# Patient Record
Sex: Male | Born: 1985 | Race: White | Hispanic: No | Marital: Single | Smoking: Current every day smoker
Health system: Southern US, Community
[De-identification: ages and names within clinical notes are randomized; demographics above are authoritative.]

## PROBLEM LIST (undated history)

## (undated) DIAGNOSIS — K649 Unspecified hemorrhoids: Secondary | ICD-10-CM

## (undated) DIAGNOSIS — M549 Dorsalgia, unspecified: Secondary | ICD-10-CM

## (undated) DIAGNOSIS — B029 Zoster without complications: Secondary | ICD-10-CM

## (undated) DIAGNOSIS — G8929 Other chronic pain: Secondary | ICD-10-CM

## (undated) DIAGNOSIS — M5431 Sciatica, right side: Secondary | ICD-10-CM

## (undated) DIAGNOSIS — B009 Herpesviral infection, unspecified: Secondary | ICD-10-CM

## (undated) HISTORY — DX: Unspecified hemorrhoids: K64.9

---
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
## 2004-01-28 HISTORY — PX: FACIAL FRACTURE SURGERY: SHX1570

## 2004-09-22 ENCOUNTER — Emergency Department (HOSPITAL_COMMUNITY): Admission: EM | Admit: 2004-09-22 | Discharge: 2004-09-22 | Payer: Self-pay | Admitting: Emergency Medicine

## 2005-11-02 ENCOUNTER — Emergency Department: Payer: Self-pay | Admitting: Unknown Physician Specialty

## 2006-06-23 ENCOUNTER — Emergency Department (HOSPITAL_COMMUNITY): Admission: EM | Admit: 2006-06-23 | Discharge: 2006-06-23 | Payer: Self-pay | Admitting: Emergency Medicine

## 2006-07-22 ENCOUNTER — Emergency Department (HOSPITAL_COMMUNITY): Admission: EM | Admit: 2006-07-22 | Discharge: 2006-07-22 | Payer: Self-pay | Admitting: Emergency Medicine

## 2006-08-03 ENCOUNTER — Emergency Department (HOSPITAL_COMMUNITY): Admission: EM | Admit: 2006-08-03 | Discharge: 2006-08-04 | Payer: Self-pay | Admitting: Emergency Medicine

## 2006-08-11 ENCOUNTER — Ambulatory Visit (HOSPITAL_COMMUNITY): Admission: RE | Admit: 2006-08-11 | Discharge: 2006-08-12 | Payer: Self-pay | Admitting: Otolaryngology

## 2007-05-25 ENCOUNTER — Emergency Department (HOSPITAL_COMMUNITY): Admission: EM | Admit: 2007-05-25 | Discharge: 2007-05-25 | Payer: Self-pay | Admitting: Emergency Medicine

## 2007-05-31 ENCOUNTER — Emergency Department (HOSPITAL_COMMUNITY): Admission: EM | Admit: 2007-05-31 | Discharge: 2007-05-31 | Payer: Self-pay | Admitting: Emergency Medicine

## 2007-12-13 ENCOUNTER — Emergency Department (HOSPITAL_COMMUNITY): Admission: EM | Admit: 2007-12-13 | Discharge: 2007-12-13 | Payer: Self-pay | Admitting: Emergency Medicine

## 2008-07-14 ENCOUNTER — Emergency Department (HOSPITAL_COMMUNITY): Admission: EM | Admit: 2008-07-14 | Discharge: 2008-07-14 | Payer: Self-pay | Admitting: Emergency Medicine

## 2008-08-24 ENCOUNTER — Emergency Department (HOSPITAL_COMMUNITY): Admission: EM | Admit: 2008-08-24 | Discharge: 2008-08-24 | Payer: Self-pay | Admitting: Emergency Medicine

## 2009-05-06 ENCOUNTER — Emergency Department (HOSPITAL_COMMUNITY): Admission: EM | Admit: 2009-05-06 | Discharge: 2009-05-06 | Payer: Self-pay | Admitting: Emergency Medicine

## 2009-07-31 ENCOUNTER — Emergency Department (HOSPITAL_COMMUNITY): Admission: EM | Admit: 2009-07-31 | Discharge: 2009-07-31 | Payer: Self-pay | Admitting: Emergency Medicine

## 2010-02-18 ENCOUNTER — Encounter: Payer: Self-pay | Admitting: Neurosurgery

## 2010-03-18 ENCOUNTER — Ambulatory Visit (HOSPITAL_COMMUNITY): Payer: Self-pay | Admitting: Physical Therapy

## 2010-03-19 ENCOUNTER — Other Ambulatory Visit (HOSPITAL_COMMUNITY): Payer: Self-pay | Admitting: Orthopaedic Surgery

## 2010-03-19 DIAGNOSIS — M545 Low back pain, unspecified: Secondary | ICD-10-CM

## 2010-03-22 ENCOUNTER — Ambulatory Visit (HOSPITAL_COMMUNITY): Admission: RE | Admit: 2010-03-22 | Payer: Medicaid Other | Source: Ambulatory Visit

## 2010-04-27 ENCOUNTER — Emergency Department (HOSPITAL_COMMUNITY): Payer: Medicaid Other

## 2010-04-27 ENCOUNTER — Emergency Department (HOSPITAL_COMMUNITY)
Admission: EM | Admit: 2010-04-27 | Discharge: 2010-04-27 | Disposition: A | Discharge status: Home / Self Care | Payer: Medicaid Other | Attending: Emergency Medicine | Admitting: Emergency Medicine

## 2010-04-27 DIAGNOSIS — S0003XA Contusion of scalp, initial encounter: Secondary | ICD-10-CM | POA: Insufficient documentation

## 2010-04-27 DIAGNOSIS — S0510XA Contusion of eyeball and orbital tissues, unspecified eye, initial encounter: Secondary | ICD-10-CM | POA: Insufficient documentation

## 2010-04-27 DIAGNOSIS — S058X9A Other injuries of unspecified eye and orbit, initial encounter: Secondary | ICD-10-CM | POA: Insufficient documentation

## 2010-04-27 DIAGNOSIS — S1093XA Contusion of unspecified part of neck, initial encounter: Secondary | ICD-10-CM | POA: Insufficient documentation
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
## 2010-07-16 ENCOUNTER — Emergency Department (HOSPITAL_COMMUNITY): Payer: Medicaid Other

## 2010-07-16 ENCOUNTER — Emergency Department (HOSPITAL_COMMUNITY)
Admission: EM | Admit: 2010-07-16 | Discharge: 2010-07-16 | Disposition: A | Discharge status: Home / Self Care | Payer: Medicaid Other | Attending: Emergency Medicine | Admitting: Emergency Medicine

## 2010-07-16 DIAGNOSIS — Y998 Other external cause status: Secondary | ICD-10-CM | POA: Insufficient documentation

## 2010-07-16 DIAGNOSIS — S93409A Sprain of unspecified ligament of unspecified ankle, initial encounter: Secondary | ICD-10-CM | POA: Insufficient documentation

## 2010-07-16 DIAGNOSIS — X500XXA Overexertion from strenuous movement or load, initial encounter: Secondary | ICD-10-CM | POA: Insufficient documentation

## 2010-11-12 LAB — CBC
MCHC: 34.6
MCV: 91.4
RBC: 4.53

## 2011-09-01 ENCOUNTER — Other Ambulatory Visit (HOSPITAL_COMMUNITY): Payer: Self-pay | Admitting: *Deleted

## 2011-09-01 DIAGNOSIS — D499 Neoplasm of unspecified behavior of unspecified site: Secondary | ICD-10-CM

## 2011-09-03 ENCOUNTER — Ambulatory Visit (HOSPITAL_COMMUNITY): Payer: Medicaid Other

## 2011-11-21 ENCOUNTER — Encounter (HOSPITAL_COMMUNITY): Payer: Self-pay

## 2011-11-21 ENCOUNTER — Emergency Department (HOSPITAL_COMMUNITY): Payer: Medicaid Other

## 2011-11-21 ENCOUNTER — Emergency Department (HOSPITAL_COMMUNITY)
Admission: EM | Admit: 2011-11-21 | Discharge: 2011-11-21 | Disposition: A | Discharge status: Home / Self Care | Payer: Medicaid Other | Attending: Emergency Medicine | Admitting: Emergency Medicine

## 2011-11-21 DIAGNOSIS — Y929 Unspecified place or not applicable: Secondary | ICD-10-CM | POA: Insufficient documentation

## 2011-11-21 DIAGNOSIS — Z0489 Encounter for examination and observation for other specified reasons: Secondary | ICD-10-CM | POA: Insufficient documentation

## 2011-11-21 DIAGNOSIS — IMO0002 Reserved for concepts with insufficient information to code with codable children: Secondary | ICD-10-CM | POA: Insufficient documentation

## 2011-11-21 DIAGNOSIS — R51 Headache: Secondary | ICD-10-CM | POA: Insufficient documentation

## 2011-11-21 DIAGNOSIS — F17200 Nicotine dependence, unspecified, uncomplicated: Secondary | ICD-10-CM | POA: Insufficient documentation

## 2011-11-21 DIAGNOSIS — M549 Dorsalgia, unspecified: Secondary | ICD-10-CM | POA: Insufficient documentation

## 2011-11-21 DIAGNOSIS — Y939 Activity, unspecified: Secondary | ICD-10-CM | POA: Insufficient documentation

## 2011-11-21 DIAGNOSIS — T07XXXA Unspecified multiple injuries, initial encounter: Secondary | ICD-10-CM | POA: Insufficient documentation

## 2011-11-21 MED ORDER — OXYCODONE-ACETAMINOPHEN 5-325 MG PO TABS: 1.0000 | ORAL_TABLET | ORAL | Status: DC | PRN

## 2011-11-21 MED ORDER — OXYCODONE-ACETAMINOPHEN 5-325 MG PO TABS
2.0000 | ORAL_TABLET | Freq: Once | ORAL | Status: AC
  Administered 2011-11-21: 2 via ORAL
  Filled 2011-11-21: qty 2
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
## 2012-02-02 ENCOUNTER — Encounter (HOSPITAL_COMMUNITY): Payer: Self-pay

## 2012-02-02 ENCOUNTER — Emergency Department (HOSPITAL_COMMUNITY)
Admission: EM | Admit: 2012-02-02 | Discharge: 2012-02-02 | Disposition: A | Discharge status: Home / Self Care | Payer: Medicaid Other | Attending: Emergency Medicine | Admitting: Emergency Medicine

## 2012-02-02 DIAGNOSIS — F17200 Nicotine dependence, unspecified, uncomplicated: Secondary | ICD-10-CM | POA: Insufficient documentation

## 2012-02-02 DIAGNOSIS — K0889 Other specified disorders of teeth and supporting structures: Secondary | ICD-10-CM

## 2012-02-02 DIAGNOSIS — K089 Disorder of teeth and supporting structures, unspecified: Secondary | ICD-10-CM | POA: Insufficient documentation

## 2012-02-02 DIAGNOSIS — X58XXXA Exposure to other specified factors, initial encounter: Secondary | ICD-10-CM | POA: Insufficient documentation

## 2012-02-02 DIAGNOSIS — Y929 Unspecified place or not applicable: Secondary | ICD-10-CM | POA: Insufficient documentation

## 2012-02-02 DIAGNOSIS — Y9389 Activity, other specified: Secondary | ICD-10-CM | POA: Insufficient documentation

## 2012-02-02 DIAGNOSIS — S025XXA Fracture of tooth (traumatic), initial encounter for closed fracture: Secondary | ICD-10-CM | POA: Insufficient documentation

## 2012-02-02 DIAGNOSIS — Z8739 Personal history of other diseases of the musculoskeletal system and connective tissue: Secondary | ICD-10-CM | POA: Insufficient documentation

## 2012-02-02 MED ORDER — OXYCODONE-ACETAMINOPHEN 5-325 MG PO TABS
1.0000 | ORAL_TABLET | Freq: Once | ORAL | Status: AC
  Administered 2012-02-02: 1 via ORAL
  Filled 2012-02-02: qty 1

## 2012-02-02 MED ORDER — OXYCODONE-ACETAMINOPHEN 5-325 MG PO TABS: 1.0000 | ORAL_TABLET | ORAL | Status: DC | PRN

## 2012-02-02 MED ORDER — CLINDAMYCIN HCL 150 MG PO CAPS: 150.0000 mg | ORAL_CAPSULE | Freq: Four times a day (QID) | ORAL | Status: DC

## 2012-02-02 NOTE — ED Notes (Signed)
Pt reports breaking a piece of his tooth off on Friday while eating some peanuts.  Pt reports severe pain and swelling.

## 2012-02-02 NOTE — ED Provider Notes (Signed)
History     CSN: [id number]  Arrival date & time 02/02/12  1021   First MD Initiated Contact with Patient 02/02/12 1048      Chief Complaint  Patient presents with  . Dental Pain     Patient is a 27 y.o. male presenting with tooth pain. The history is provided by the patient.  Dental PainThe primary symptoms include mouth pain. Primary symptoms do not include fever. The symptoms began 3 to 5 days ago. The symptoms are worsening. The symptoms are new. The symptoms occur constantly.  pt reports he may have broken off a tooth while eating peanuts last week   Past Medical History  Diagnosis Date  . Back pain     History reviewed. No pertinent past surgical history.  No family history on file.  History  Substance Use Topics  . Smoking status: Current Every Day Smoker  . Smokeless tobacco: Not on file  . Alcohol Use: No      Review of Systems  Constitutional: Negative for fever.  Gastrointestinal: Negative for vomiting.    Allergies  Penicillins and Hydrocodone  Home Medications   Current Outpatient Rx  Name  Route  Sig  Dispense  Refill  . CLINDAMYCIN HCL 150 MG PO CAPS   Oral   Take 1 capsule (150 mg total) by mouth every 6 (six) hours.   28 capsule   0   . OXYCODONE-ACETAMINOPHEN 5-325 MG PO TABS   Oral   Take 1 tablet by mouth every 4 (four) hours as needed for pain.   5 tablet   0     BP 145/84  Pulse 109  Temp 97.5 F (36.4 C) (Oral)  Resp 18  Wt 200 lb (90.719 kg)  SpO2 98%  Physical Exam CONSTITUTIONAL: Well developed/well nourished HEAD AND FACE: Normocephalic/atraumatic EYES: EOMI/PERRL ENMT: Mucous membranes moist.  Poor dentition.  No trismus.  No focal abscess noted. Fractured tooth noted to right upper premolar NECK: supple no meningeal signs CV: S1/S2 noted, no murmurs/rubs/gallops noted LUNGS: Lungs are clear to auscultation bilaterally, no apparent distress ABDOMEN: soft, nontender, no rebound or guarding NEURO: Pt is  awake/alert, moves all extremitiesx4 EXTREMITIES:full ROM SKIN: warm, color normal  ED Course  Procedures   1. Pain, dental       MDM  Nursing notes including past medical history and social history reviewed and considered in documentation  Pt reports he already has dental f/u this week He reports he can take percocet         Joya Gaskins, MD 02/02/12 1059

## 2012-02-25 ENCOUNTER — Encounter (HOSPITAL_COMMUNITY): Payer: Self-pay | Admitting: Emergency Medicine

## 2012-02-25 ENCOUNTER — Emergency Department (HOSPITAL_COMMUNITY): Payer: Medicaid Other

## 2012-02-25 ENCOUNTER — Emergency Department (HOSPITAL_COMMUNITY)
Admission: EM | Admit: 2012-02-25 | Discharge: 2012-02-25 | Disposition: A | Discharge status: Home / Self Care | Payer: Medicaid Other | Attending: Emergency Medicine | Admitting: Emergency Medicine

## 2012-02-25 DIAGNOSIS — F17200 Nicotine dependence, unspecified, uncomplicated: Secondary | ICD-10-CM | POA: Insufficient documentation

## 2012-02-25 DIAGNOSIS — S39012A Strain of muscle, fascia and tendon of lower back, initial encounter: Secondary | ICD-10-CM

## 2012-02-25 DIAGNOSIS — Y939 Activity, unspecified: Secondary | ICD-10-CM | POA: Insufficient documentation

## 2012-02-25 DIAGNOSIS — S59909A Unspecified injury of unspecified elbow, initial encounter: Secondary | ICD-10-CM | POA: Insufficient documentation

## 2012-02-25 DIAGNOSIS — Y9289 Other specified places as the place of occurrence of the external cause: Secondary | ICD-10-CM | POA: Insufficient documentation

## 2012-02-25 DIAGNOSIS — S6990XA Unspecified injury of unspecified wrist, hand and finger(s), initial encounter: Secondary | ICD-10-CM | POA: Insufficient documentation

## 2012-02-25 DIAGNOSIS — W010XXA Fall on same level from slipping, tripping and stumbling without subsequent striking against object, initial encounter: Secondary | ICD-10-CM | POA: Insufficient documentation

## 2012-02-25 DIAGNOSIS — S335XXA Sprain of ligaments of lumbar spine, initial encounter: Secondary | ICD-10-CM | POA: Insufficient documentation

## 2012-02-25 DIAGNOSIS — S92919A Unspecified fracture of unspecified toe(s), initial encounter for closed fracture: Secondary | ICD-10-CM | POA: Insufficient documentation

## 2012-02-25 MED ORDER — OXYCODONE-ACETAMINOPHEN 5-325 MG PO TABS: 1.0000 | ORAL_TABLET | ORAL | Status: AC | PRN

## 2012-02-25 MED ORDER — IBUPROFEN 600 MG PO TABS: 600.0000 mg | ORAL_TABLET | Freq: Four times a day (QID) | ORAL | Status: AC | PRN

## 2012-02-25 MED ORDER — OXYCODONE-ACETAMINOPHEN 5-325 MG PO TABS
1.0000 | ORAL_TABLET | Freq: Once | ORAL | Status: AC
  Administered 2012-02-25: 1 via ORAL
  Filled 2012-02-25: qty 1

## 2012-02-25 MED ORDER — IBUPROFEN 800 MG PO TABS
800.0000 mg | ORAL_TABLET | Freq: Once | ORAL | Status: AC
  Administered 2012-02-25: 800 mg via ORAL
  Filled 2012-02-25: qty 1

## 2012-02-25 NOTE — ED Provider Notes (Signed)
History     CSN: [id number]  Arrival date & time 02/25/12  1402   First MD Initiated Contact with Patient 02/25/12 1443      Chief Complaint  Patient presents with  . Fall    (Consider location/radiation/quality/duration/timing/severity/associated sxs/prior treatment) HPI Comments: Alex Mullen is a 27 y.o. Male presenting with pain in his right lower back which radiates into his posterior upper thigh, left foot especially fifth toe pain with ecchymosis and left wrist pain after slipping in his hallway yesterday evening, causing him to slide down a flight of steps.  He has been ambulatory today but with discomfort.  He is fairly certain he has broken his left fifth toe.  He does have a history of prior injury to his lower back which was completely healed prior to yesterday's fall.  Pain is constant but worse with certain movements.  He denies weakness or numbness in his lower extremities and has had no urinary or bowel incontinence or retention.  He has used no medications prior to arrival for treatment of his pain.     The history is provided by the patient.    Past Medical History  Diagnosis Date  . Back pain     History reviewed. No pertinent past surgical history.  History reviewed. No pertinent family history.  History  Substance Use Topics  . Smoking status: Current Every Day Smoker  . Smokeless tobacco: Not on file  . Alcohol Use: No      Review of Systems  Constitutional: Negative for fever.  Respiratory: Negative for shortness of breath.   Cardiovascular: Negative for chest pain and leg swelling.  Gastrointestinal: Negative for abdominal pain, constipation and abdominal distention.  Genitourinary: Negative for dysuria, urgency, frequency, flank pain and difficulty urinating.  Musculoskeletal: Positive for back pain and arthralgias. Negative for joint swelling and gait problem.  Skin: Negative for rash.  Neurological: Negative for weakness and  numbness.    Allergies  Penicillins and Hydrocodone  Home Medications   Current Outpatient Rx  Name  Route  Sig  Dispense  Refill  . IBUPROFEN 600 MG PO TABS   Oral   Take 1 tablet (600 mg total) by mouth every 6 (six) hours as needed for pain.   20 tablet   0   . OXYCODONE-ACETAMINOPHEN 5-325 MG PO TABS   Oral   Take 1 tablet by mouth every 4 (four) hours as needed for pain.   20 tablet   0     BP 115/94  Pulse 102  Temp 97.4 F (36.3 C) (Oral)  Resp 17  SpO2 100%  Physical Exam  Nursing note and vitals reviewed. Constitutional: He appears well-developed and well-nourished.  HENT:  Head: Normocephalic and atraumatic.  Eyes: Conjunctivae normal are normal.  Neck: Normal range of motion. Neck supple.  Cardiovascular: Normal rate and intact distal pulses.        Pedal pulses normal.  Pulmonary/Chest: Effort normal.  Abdominal: Soft. Bowel sounds are normal. He exhibits no distension and no mass.  Musculoskeletal: Normal range of motion. He exhibits tenderness. He exhibits no edema.       Lumbar back: He exhibits tenderness. He exhibits no swelling, no edema and no spasm.       Left foot: He exhibits tenderness and swelling.       Right paralumbar tenderness to palpation.  No spinous process tenderness or SI joint tenderness.  Edema and ecchymosis noted around the proximal fifth left toe.  Distal sensation  intact, less than 3 second cap refill.  Neurological: He is alert. He has normal strength. He displays no atrophy, no tremor and normal reflexes. No sensory deficit. Gait normal.  Reflex Scores:      Patellar reflexes are 2+ on the right side and 2+ on the left side.      Achilles reflexes are 2+ on the right side and 2+ on the left side.      No strength deficit noted in hip and knee flexor and extensor muscle groups.  Ankle flexion and extension intact.  Skin: Skin is warm and dry.  Psychiatric: He has a normal mood and affect.    ED Course  Procedures  (including critical care time)      Labs Reviewed - No data to display Dg Lumbar Spine Complete  02/25/2012  *RADIOLOGY REPORT*  Clinical Data: Fall.  Low back pain.  LUMBAR SPINE - COMPLETE 4+ VIEW  Comparison: 11/21/2011  Findings: There are five lumbar-type vertebral bodies.  No fracture or malalignment.  Disc spaces well maintained.  SI joints are symmetric.  IMPRESSION: Normal study.   Original Report Authenticated By: Charlett Nose, M.D.    Dg Wrist Complete Left  02/25/2012  *RADIOLOGY REPORT*  Clinical Data: Fall.  Wrist pain.  LEFT WRIST - COMPLETE 3+ VIEW  Comparison: None.  Findings: No acute bony abnormality.  Specifically, no fracture, subluxation, or dislocation.  Soft tissues are intact.  Joint spaces are maintained.  IMPRESSION: No acute bony abnormality.   Original Report Authenticated By: Charlett Nose, M.D.    Dg Foot Complete Left  02/25/2012  *RADIOLOGY REPORT*  Clinical Data: Fall, foot pain.  LEFT FOOT - COMPLETE 3+ VIEW  Comparison: None  Findings: There is a fracture through the left fifth toe proximal phalanx and the.  This is nondisplaced and.  No visible intra- articular extension.  No additional acute bony abnormality.  IMPRESSION: Fracture through the left fifth toe proximal phalanx.   Original Report Authenticated By: Charlett Nose, M.D.      1. Fracture of phalanx of toe   2. Strain of lumbar spine       MDM  X-rays reviewed prior to discharge home.  Patient placed in a postop shoe with buddy tape.  Work note given for light duty.  Prescribed Percocet and ibuprofen for pain relief.  Referral to Dr. Hilda Lias for followup care of his injury.        Burgess Amor, [place] 02/25/12 [phone]

## 2012-02-25 NOTE — ED Provider Notes (Signed)
Medical screening examination/treatment/procedure(s) were performed by non-physician practitioner and as supervising physician I was immediately available for consultation/collaboration.   Shiven Junious L Maelyn Berrey, MD 02/25/12 2038 

## 2012-02-25 NOTE — ED Notes (Signed)
Pt tripped and fell last night. C/o pain to L pinky toe. Slight redness noted. C/o chronic pain to lower back and is hurting/burning now and going down r leg.

## 2012-02-25 NOTE — ED Notes (Signed)
Pt reports that one of his friends will be his ride home. Pt reports that he is not driving.
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
## 2012-02-28 DIAGNOSIS — B009 Herpesviral infection, unspecified: Secondary | ICD-10-CM

## 2012-02-28 HISTORY — DX: Herpesviral infection, unspecified: B00.9

## 2012-03-16 ENCOUNTER — Emergency Department (HOSPITAL_COMMUNITY)
Admission: EM | Admit: 2012-03-16 | Discharge: 2012-03-16 | Disposition: A | Discharge status: Home / Self Care | Payer: Medicaid Other | Attending: Emergency Medicine | Admitting: Emergency Medicine

## 2012-03-16 ENCOUNTER — Encounter (HOSPITAL_COMMUNITY): Payer: Self-pay

## 2012-03-16 DIAGNOSIS — F17200 Nicotine dependence, unspecified, uncomplicated: Secondary | ICD-10-CM | POA: Insufficient documentation

## 2012-03-16 DIAGNOSIS — L03211 Cellulitis of face: Secondary | ICD-10-CM | POA: Insufficient documentation

## 2012-03-16 DIAGNOSIS — L0201 Cutaneous abscess of face: Secondary | ICD-10-CM | POA: Insufficient documentation

## 2012-03-16 DIAGNOSIS — Z8619 Personal history of other infectious and parasitic diseases: Secondary | ICD-10-CM | POA: Insufficient documentation

## 2012-03-16 DIAGNOSIS — R599 Enlarged lymph nodes, unspecified: Secondary | ICD-10-CM | POA: Insufficient documentation

## 2012-03-16 DIAGNOSIS — Z8739 Personal history of other diseases of the musculoskeletal system and connective tissue: Secondary | ICD-10-CM | POA: Insufficient documentation

## 2012-03-16 LAB — CBC WITH DIFFERENTIAL/PLATELET
Basophils Relative: 0 % (ref 0–1)
Eosinophils Absolute: 0.1 10*3/uL (ref 0.0–0.7)
Eosinophils Relative: 1 % (ref 0–5)
HCT: 42.1 % (ref 39.0–52.0)
Hemoglobin: 15.4 g/dL (ref 13.0–17.0)
Lymphs Abs: 1.7 10*3/uL (ref 0.7–4.0)
MCH: 32.6 pg (ref 26.0–34.0)
MCHC: 36.6 g/dL — ABNORMAL HIGH (ref 30.0–36.0)
MCV: 89 fL (ref 78.0–100.0)
Monocytes Absolute: 0.6 10*3/uL (ref 0.1–1.0)
Monocytes Relative: 7 % (ref 3–12)
Neutrophils Relative %: 72 % (ref 43–77)
RBC: 4.73 MIL/uL (ref 4.22–5.81)

## 2012-03-16 LAB — BASIC METABOLIC PANEL
BUN: 8 mg/dL (ref 6–23)
CO2: 25 mEq/L (ref 19–32)
Glucose, Bld: 107 mg/dL — ABNORMAL HIGH (ref 70–99)
Potassium: 4.3 mEq/L (ref 3.5–5.1)
Sodium: 137 mEq/L (ref 135–145)

## 2012-03-16 MED ORDER — HYDROMORPHONE HCL PF 1 MG/ML IJ SOLN
1.0000 mg | Freq: Once | INTRAMUSCULAR | Status: AC
  Administered 2012-03-16: 1 mg via INTRAVENOUS
  Filled 2012-03-16: qty 1

## 2012-03-16 MED ORDER — DOXYCYCLINE HYCLATE 100 MG PO CAPS: 100.0000 mg | ORAL_CAPSULE | Freq: Two times a day (BID) | ORAL | Status: DC

## 2012-03-16 MED ORDER — SODIUM CHLORIDE 0.9 % IV BOLUS (SEPSIS)
1000.0000 mL | Freq: Once | INTRAVENOUS | Status: AC
  Administered 2012-03-16: 1000 mL via INTRAVENOUS

## 2012-03-16 MED ORDER — TRAMADOL HCL 50 MG PO TABS: 50.0000 mg | ORAL_TABLET | Freq: Four times a day (QID) | ORAL | Status: DC | PRN

## 2012-03-16 MED ORDER — ONDANSETRON HCL 4 MG/2ML IJ SOLN
4.0000 mg | Freq: Once | INTRAMUSCULAR | Status: AC
  Administered 2012-03-16: 4 mg via INTRAVENOUS
  Filled 2012-03-16: qty 2

## 2012-03-16 MED ORDER — KETOROLAC TROMETHAMINE 30 MG/ML IJ SOLN
30.0000 mg | Freq: Once | INTRAMUSCULAR | Status: AC
  Administered 2012-03-16: 30 mg via INTRAVENOUS
  Filled 2012-03-16: qty 1

## 2012-03-16 MED ORDER — VANCOMYCIN HCL IN DEXTROSE 1-5 GM/200ML-% IV SOLN
1000.0000 mg | Freq: Once | INTRAVENOUS | Status: AC
  Administered 2012-03-16: 1000 mg via INTRAVENOUS
  Filled 2012-03-16: qty 200

## 2012-03-16 NOTE — ED Notes (Signed)
Patient would like something for pain and something to drink. RN made aware.

## 2012-03-16 NOTE — ED Notes (Signed)
Pt c/o "bumps" to left forehead area and are located in hair area. Denies any injury, states that he noticed the "bumps" a few days ago, pt also has swelling to left forehead and left eye area that started this am, pt and family updated on plan of care

## 2012-03-16 NOTE — ED Notes (Signed)
Pt reports 2 days ago noticed a painful "bump" on forehead.  Reports yesterday evening noticed left side of face swelling.  Also has 2 knots to left side of neck.

## 2012-03-16 NOTE — ED Provider Notes (Signed)
History  This chart was scribed for Alex Hutching, MD by Ardeen Jourdain, ED Scribe. This patient was seen in room APA10/[facility]10 and the patient's care was started at 1057.  CSN: [id number]  Arrival date & time 03/16/12  1044   First MD Initiated Contact with Patient 03/16/12 1057      Chief Complaint  Patient presents with  . Facial Swelling     The history is provided by the patient. No language interpreter was used.    Alex Mullen is a 27 y.o. male who presents to the Emergency Department complaining of facial swelling that began 2 days ago and has been gradually worsening. He states the symptoms began as a "knot" on his forehead and gradually began spreading. 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 He denies any other symptoms at this time.    Past Medical History  Diagnosis Date  . Back pain     History reviewed. No pertinent past surgical history.  No family history on file.  History  Substance Use Topics  . Smoking status: Current Every Day Smoker  . Smokeless tobacco: Not on file  . Alcohol Use: No      Review of Systems  All other systems reviewed and are negative.   A complete 10 system review of systems was obtained and all systems are negative except as noted in the HPI and PMH.    Allergies  Penicillins and Hydrocodone  Home Medications   Current Outpatient Rx  Name  Route  Sig  Dispense  Refill  . Tetrahydrozoline HCl (VISINE OP)   Ophthalmic   Apply 2 drops to eye 3 (three) times daily as needed (eye redness).           Triage Vitals: BP 142/80  Pulse 112  Temp(Src) 98.3 F (36.8 C) (Oral)  Resp 20  Ht 6' (1.829 m)  Wt 195 lb (88.451 kg)  BMI 26.44 kg/m2  SpO2 96%  Physical Exam  Nursing note and vitals reviewed. Constitutional: He is oriented to person, place, and time. He appears well-developed and well-nourished.  HENT:  Head: Normocephalic and atraumatic.  Erythematous papular, vesicular  rash to left forehead, left upper eyelid and inferior to left eye. Tender to left superior, lateral area.    Eyes: Conjunctivae and EOM are normal. Pupils are equal, round, and reactive to light.  Neck: Normal range of motion. Neck supple.  Left anterior cervical adenopathy   Cardiovascular: Normal rate, regular rhythm and normal heart sounds.   Pulmonary/Chest: Effort normal and breath sounds normal.  Abdominal: Soft. Bowel sounds are normal.  Musculoskeletal: Normal range of motion.  Lymphadenopathy:    He has cervical adenopathy.  Neurological: He is alert and oriented to person, place, and time.  Skin: Skin is warm and dry.  Psychiatric: He has a normal mood and affect.    ED Course  Procedures (including critical care time)  DIAGNOSTIC STUDIES: Oxygen Saturation is 96% on room air, adequate by my interpretation.    COORDINATION OF CARE:  11:22 AM: Discussed treatment plan which includes antibiotics with pt at bedside and pt agreed to plan.     Results for orders placed during the hospital encounter of 03/16/12  BASIC METABOLIC PANEL      Result Value Range   Sodium 137  135 - 145 mEq/L   Potassium 4.3  3.5 - 5.1 mEq/L   Chloride 100  96 - 112 mEq/L   CO2  25  19 - 32 mEq/L   Glucose, Bld 107 (*) 70 - 99 mg/dL   BUN 8  6 - 23 mg/dL   Creatinine, Ser 4.09  0.50 - 1.35 mg/dL   Calcium 9.3  8.4 - 81.1 mg/dL   GFR calc non Af Amer >90  >90 mL/min   GFR calc Af Amer >90  >90 mL/min  CBC WITH DIFFERENTIAL      Result Value Range   WBC 8.5  4.0 - 10.5 K/uL   RBC 4.73  4.22 - 5.81 MIL/uL   Hemoglobin 15.4  13.0 - 17.0 g/dL   HCT 91.4  78.2 - 95.6 %   MCV 89.0  78.0 - 100.0 fL   MCH 32.6  26.0 - 34.0 pg   MCHC 36.6 (*) 30.0 - 36.0 g/dL   RDW 21.3  08.6 - 57.8 %   Platelets 205  150 - 400 K/uL   Neutrophils Relative 72  43 - 77 %   Neutro Abs 6.1  1.7 - 7.7 K/uL   Lymphocytes Relative 20  12 - 46 %   Lymphs Abs 1.7  0.7 - 4.0 K/uL   Monocytes Relative 7  3 - 12 %    Monocytes Absolute 0.6  0.1 - 1.0 K/uL   Eosinophils Relative 1  0 - 5 %   Eosinophils Absolute 0.1  0.0 - 0.7 K/uL   Basophils Relative 0  0 - 1 %   Basophils Absolute 0.0  0.0 - 0.1 K/uL    No results found.   No diagnosis found.    MDM  Dx Facial cellulitis.  No obvious periorbital cellulitis. Rx IV vancomycin. Discharge meds doxycycline 100 mg twice a day for 10 days. patient will return if worse      I personally performed the services described in this documentation, which was scribed in my presence. The recorded information has been reviewed and is accurate.    Alex Hutching, MD 03/16/12 [phone]

## 2012-03-16 NOTE — ED Notes (Signed)
Dr. Adriana Simas at bedside talking with pt and family, pt requesting additional pain medication,

## 2012-03-17 ENCOUNTER — Encounter (HOSPITAL_COMMUNITY): Payer: Self-pay

## 2012-03-17 ENCOUNTER — Inpatient Hospital Stay (HOSPITAL_COMMUNITY)
Admission: EM | Admit: 2012-03-17 | Discharge: 2012-03-18 | DRG: 125 | Disposition: A | Discharge status: Home / Self Care | Payer: Medicaid Other | Attending: Internal Medicine | Admitting: Internal Medicine

## 2012-03-17 DIAGNOSIS — H05019 Cellulitis of unspecified orbit: Secondary | ICD-10-CM | POA: Diagnosis present

## 2012-03-17 DIAGNOSIS — Z792 Long term (current) use of antibiotics: Secondary | ICD-10-CM

## 2012-03-17 DIAGNOSIS — Z881 Allergy status to other antibiotic agents status: Secondary | ICD-10-CM

## 2012-03-17 DIAGNOSIS — B029 Zoster without complications: Secondary | ICD-10-CM

## 2012-03-17 DIAGNOSIS — Z885 Allergy status to narcotic agent status: Secondary | ICD-10-CM

## 2012-03-17 DIAGNOSIS — IMO0001 Reserved for inherently not codable concepts without codable children: Secondary | ICD-10-CM

## 2012-03-17 DIAGNOSIS — B0233 Zoster keratitis: Principal | ICD-10-CM

## 2012-03-17 DIAGNOSIS — B0230 Zoster ocular disease, unspecified: Secondary | ICD-10-CM

## 2012-03-17 DIAGNOSIS — F17200 Nicotine dependence, unspecified, uncomplicated: Secondary | ICD-10-CM | POA: Diagnosis present

## 2012-03-17 DIAGNOSIS — B0239 Other herpes zoster eye disease: Secondary | ICD-10-CM

## 2012-03-17 DIAGNOSIS — S92912A Unspecified fracture of left toe(s), initial encounter for closed fracture: Secondary | ICD-10-CM

## 2012-03-17 DIAGNOSIS — Z880 Allergy status to penicillin: Secondary | ICD-10-CM

## 2012-03-17 DIAGNOSIS — Z720 Tobacco use: Secondary | ICD-10-CM | POA: Diagnosis present

## 2012-03-17 DIAGNOSIS — Z79899 Other long term (current) drug therapy: Secondary | ICD-10-CM

## 2012-03-17 HISTORY — DX: Herpesviral infection, unspecified: B00.9

## 2012-03-17 HISTORY — DX: Zoster without complications: B02.9

## 2012-03-17 MED ORDER — SODIUM CHLORIDE 0.9 % IV SOLN
250.0000 mL | INTRAVENOUS | Status: DC | PRN
  Administered 2012-03-18: 500 mL via INTRAVENOUS

## 2012-03-17 MED ORDER — TETRACAINE HCL 0.5 % OP SOLN
OPHTHALMIC | Status: AC
  Administered 2012-03-17: 2 [drp]
  Filled 2012-03-17: qty 2

## 2012-03-17 MED ORDER — FLUORESCEIN SODIUM 1 MG OP STRP
ORAL_STRIP | OPHTHALMIC | Status: AC
  Administered 2012-03-17: 03:00:00
  Filled 2012-03-17: qty 1

## 2012-03-17 MED ORDER — DEXAMETHASONE 0.1 % OP SUSP
2.0000 [drp] | OPHTHALMIC | Status: DC
  Administered 2012-03-17: 2 [drp] via OPHTHALMIC
  Filled 2012-03-17: qty 5

## 2012-03-17 MED ORDER — ALUM & MAG HYDROXIDE-SIMETH 200-200-20 MG/5ML PO SUSP: 30.0000 mL | Freq: Four times a day (QID) | ORAL | Status: DC | PRN

## 2012-03-17 MED ORDER — LEVOFLOXACIN IN D5W 750 MG/150ML IV SOLN
750.0000 mg | INTRAVENOUS | Status: DC
  Administered 2012-03-17: 750 mg via INTRAVENOUS
  Filled 2012-03-17 (×2): qty 150

## 2012-03-17 MED ORDER — SODIUM CHLORIDE 0.9 % IJ SOLN: 3.0000 mL | INTRAMUSCULAR | Status: DC | PRN

## 2012-03-17 MED ORDER — ACYCLOVIR SODIUM 50 MG/ML IV SOLN
INTRAVENOUS | Status: AC
  Filled 2012-03-17: qty 10

## 2012-03-17 MED ORDER — NICOTINE 14 MG/24HR TD PT24
14.0000 mg | MEDICATED_PATCH | TRANSDERMAL | Status: DC
  Administered 2012-03-17: 14 mg via TRANSDERMAL
  Filled 2012-03-17 (×3): qty 1

## 2012-03-17 MED ORDER — ACETAMINOPHEN 325 MG PO TABS: 650.0000 mg | ORAL_TABLET | Freq: Four times a day (QID) | ORAL | Status: DC | PRN

## 2012-03-17 MED ORDER — VANCOMYCIN HCL 10 G IV SOLR
1500.0000 mg | Freq: Once | INTRAVENOUS | Status: AC
  Administered 2012-03-17: 1500 mg via INTRAVENOUS
  Filled 2012-03-17: qty 1500

## 2012-03-17 MED ORDER — OXYCODONE HCL 5 MG PO TABS
5.0000 mg | ORAL_TABLET | ORAL | Status: DC | PRN
  Administered 2012-03-17: 10 mg via ORAL
  Filled 2012-03-17: qty 2

## 2012-03-17 MED ORDER — ACETAMINOPHEN 650 MG RE SUPP: 650.0000 mg | Freq: Four times a day (QID) | RECTAL | Status: DC | PRN

## 2012-03-17 MED ORDER — ARTIFICIAL TEARS OP OINT
TOPICAL_OINTMENT | OPHTHALMIC | Status: DC
  Administered 2012-03-17: 22:00:00 via OPHTHALMIC
  Administered 2012-03-17: 1 via OPHTHALMIC
  Administered 2012-03-18 (×4): via OPHTHALMIC
  Filled 2012-03-17 (×2): qty 3.5

## 2012-03-17 MED ORDER — HYDROMORPHONE HCL PF 1 MG/ML IJ SOLN
1.0000 mg | INTRAMUSCULAR | Status: DC | PRN
  Administered 2012-03-17 (×3): 1 mg via INTRAVENOUS
  Filled 2012-03-17 (×2): qty 1

## 2012-03-17 MED ORDER — DEXTROSE 5 % IV SOLN
10.0000 mg/kg | Freq: Three times a day (TID) | INTRAVENOUS | Status: DC
  Administered 2012-03-17 (×2): 885 mg via INTRAVENOUS
  Filled 2012-03-17 (×6): qty 17.7

## 2012-03-17 MED ORDER — IBUPROFEN 600 MG PO TABS
600.0000 mg | ORAL_TABLET | Freq: Three times a day (TID) | ORAL | Status: DC
  Administered 2012-03-17 – 2012-03-18 (×3): 600 mg via ORAL
  Filled 2012-03-17 (×5): qty 1

## 2012-03-17 MED ORDER — VALACYCLOVIR HCL 500 MG PO TABS
1000.0000 mg | ORAL_TABLET | Freq: Three times a day (TID) | ORAL | Status: DC
  Administered 2012-03-17 – 2012-03-18 (×3): 1000 mg via ORAL
  Filled 2012-03-17 (×6): qty 2

## 2012-03-17 MED ORDER — DEXAMETHASONE SODIUM PHOSPHATE 0.1 % OP SOLN
2.0000 [drp] | OPHTHALMIC | Status: DC
  Filled 2012-03-17: qty 5

## 2012-03-17 MED ORDER — OXYCODONE HCL 5 MG PO TABS
10.0000 mg | ORAL_TABLET | ORAL | Status: DC | PRN
  Administered 2012-03-17 – 2012-03-18 (×5): 10 mg via ORAL
  Filled 2012-03-17 (×4): qty 2

## 2012-03-17 MED ORDER — ONDANSETRON HCL 4 MG/2ML IJ SOLN: 4.0000 mg | Freq: Four times a day (QID) | INTRAMUSCULAR | Status: DC | PRN

## 2012-03-17 MED ORDER — HYDROMORPHONE HCL PF 1 MG/ML IJ SOLN
2.0000 mg | INTRAMUSCULAR | Status: DC | PRN
  Administered 2012-03-17 – 2012-03-18 (×10): 2 mg via INTRAVENOUS
  Filled 2012-03-17 (×9): qty 2

## 2012-03-17 MED ORDER — SENNOSIDES-DOCUSATE SODIUM 8.6-50 MG PO TABS: 1.0000 | ORAL_TABLET | Freq: Every evening | ORAL | Status: DC | PRN

## 2012-03-17 MED ORDER — SODIUM CHLORIDE 0.9 % IJ SOLN
3.0000 mL | Freq: Two times a day (BID) | INTRAMUSCULAR | Status: DC
  Administered 2012-03-17: 3 mL via INTRAVENOUS

## 2012-03-17 MED ORDER — LORAZEPAM 2 MG/ML IJ SOLN
0.5000 mg | INTRAMUSCULAR | Status: DC | PRN
  Administered 2012-03-17: 0.5 mg via INTRAVENOUS
  Filled 2012-03-17: qty 1

## 2012-03-17 MED ORDER — ZOLPIDEM TARTRATE 5 MG PO TABS
5.0000 mg | ORAL_TABLET | Freq: Every evening | ORAL | Status: DC | PRN
  Administered 2012-03-18: 5 mg via ORAL
  Filled 2012-03-17: qty 1

## 2012-03-17 MED ORDER — HYDROMORPHONE HCL PF 1 MG/ML IJ SOLN
INTRAMUSCULAR | Status: AC
  Administered 2012-03-17: 1 mg
  Filled 2012-03-17: qty 1

## 2012-03-17 MED ORDER — ONDANSETRON HCL 4 MG PO TABS: 4.0000 mg | ORAL_TABLET | Freq: Four times a day (QID) | ORAL | Status: DC | PRN

## 2012-03-17 NOTE — Progress Notes (Signed)
NURSING PROGRESS NOTE  Alex Mullen [id number] Admission Data: 03/17/2012 8:43 AM Attending Provider: Elease Etienne, MD PCP:No primary provider on file. Code Status: full  Alex Mullen is a 27 y.o. male patient admitted from ED  No acute distress noted.  No c/o shortness of breath, no c/o chest pain.    Blood pressure 134/79, pulse 59, temperature 97.7 F (36.5 C), temperature source Oral, resp. rate 20, height 5\' 9"  (1.753 m), weight 89.6 kg (197 lb 8.5 oz), SpO2 98.00%.   IV Fluids:  IV in place, occlusive dsg intact without redness Allergies:  Doxycycline; Penicillins; and Hydrocodone  Past Medical History:   has a past medical history of Back pain.  Past Surgical History:   has no past surgical history on file.  Social History:   reports that he has been smoking.  He does not have any smokeless tobacco history on file. He reports that he does not drink alcohol or use illicit drugs.  Skin: cellulitis to forehead/left eye (shingles)  Orientation to room, and floor completed with information packet given to patient/family. Admission INP armband ID verified with patient/family, and in place.   SR up x 2, fall assessment complete, with patient and family able to verbalize understanding of risk associated with falls, and verbalized understanding to call for assistance before getting out of bed.   Call light within reach. Patient able to voice and demonstrate understanding of unit orientation instructions.   Will continue to evaluate and treat per MD orders.  Rosalie Doctor, RN

## 2012-03-17 NOTE — H&P (Signed)
[facility] History and Physical  DWAN HEMMELGARN ZOX:[id number] DOB: 1985-03-09 DOA: 03/17/2012  Referring physician: EDP Delo PCP: No primary provider on file.  Specialists: none  Chief Complaint: Left eye pain, face swelling  HPI: Alex Mullen is a 27 y.o. male without a significant past medical history other a remote left maxilla/zygomatic facial fracture in 2006 and a recent accidental fall 5 days ago where he sprained his back, neck and fractured his left 4th and 5th toes. He was seen yesterday in the [facility] and discharged home with an oral antibiotic (Doxycycline) for what was felt to be a superficial facial skin infection but he returned to the ED this evening continued to get much worse with an eruption of vesicles across his left forehead, external ear and then had worsening redness and swelling around his eye with lid droop, intense eye pain and blurry vision loss in the left eye. Lesions are intensely painful. He denies any fever or chills, no exposures to shingle or chickenpox. He denies any focal neurological symptoms.  Admission requested for Herpes Zoster, Ophthalmicus and transfer to [facility] recommended for urgent Ophthalmology consult.   Review of Systems: The patient denies anorexia, fever, weight loss,decreased hearing, hoarseness, chest pain, syncope, dyspnea on exertion, peripheral edema, balance deficits, hemoptysis, abdominal pain, melena, hematochezia, severe indigestion/heartburn, hematuria, incontinence, genital sores, muscle weakness, difficulty walking, depression, unusual weight change, abnormal bleeding, enlarged lymph nodes, angioedema, and breast masses.    Past Medical History  Diagnosis Date  . Back pain    History reviewed. No pertinent past surgical history. Social History:  reports that he has been smoking.  He does not have any smokeless tobacco history on file. He reports that he does not drink alcohol or use illicit drugs. Lives with his  wife and 3 young children. He works in [employer] representative, was almost going to be offered a full time position with health insurance with company, but now he is worried about losing his job, he is uninsured, anxiety about costs of his hospitalization. He has a remote history of incarceration but tells me he has "cleaned up" and he denies any history of IV drug use.  Allergies  Allergen Reactions  . Doxycycline   . Penicillins Other (See Comments)    Childhood Allergy  . Hydrocodone Rash    Makes him "red"    No family history on file. Denies family history of neurological problems.  Prior to Admission medications   Medication Sig Start Date End Date Taking? Authorizing Provider  Tetrahydrozoline HCl (VISINE OP) Apply 2 drops to eye 3 (three) times daily as needed (eye redness).   Yes Historical Provider, MD  doxycycline (VIBRAMYCIN) 100 MG capsule Take 1 capsule (100 mg total) by mouth 2 (two) times daily. 03/16/12   Donnetta Hutching, MD  traMADol (ULTRAM) 50 MG tablet Take 1 tablet (50 mg total) by mouth every 6 (six) hours as needed for pain. 03/16/12   Donnetta Hutching, MD   Physical Exam: Ceasar Mons Vitals:   03/17/12 0319  Weight: 88.451 kg (195 lb)     General:  Well developed, well norished-appears to be in moderate distress  Eyes: Left eye with periorbital edema, lid droop, increased lacrimation, conjunctiva red, flouracine examination with lamp reveals a large left lateral corneal ulceration. Cannot fully exam him EOMs due to lid droop and edema. Highly light sensitive.  HENT: There is a vesicular elevated rash across the left side of his forehead, early lesions in the ear canal with redness, induration  and swelling superimposed on the lesions. Lesions are extremely painful to light touch.  Neck: mild pain on palpation , reduced ROM  Cardiovascular: RRR  Respiratory: CTAB  Abdomen: normal  Skin: as above. No other lesions or rashes  Musculoskeletal: normal  Psychiatric:  normal  Neurologic: non-focal  Labs on Admission:  Basic Metabolic Panel:  Recent Labs Lab 03/16/12 1228  NA 137  K 4.3  CL 100  CO2 25  GLUCOSE 107*  BUN 8  CREATININE 0.92  CALCIUM 9.3   CBC:  Recent Labs Lab 03/16/12 1228  WBC 8.5  NEUTROABS 6.1  HGB 15.4  HCT 42.1  MCV 89.0  PLT 205    Assessment/Plan Principal Problem:   Ophthalmic herpes zoster infection Active Problems:   Herpes zoster keratitis   Orbital cellulitis   Toe fracture, left  Mr. Wangerin is an otherwise healthy 27 yo who has what appears clinically to be herpes zoster ophthalmicus, with conjunctivitis/keratitis and worsening lid droop and vision loss, evidence of corneal lesion on fluoricine exam-the nerve root stribution is left lateral facial nerve, left trigeminal nerve distribution with corneal involvement. Given the surrounding erythema and induration secondary bacterial infection is strongly suspected including an orbital cellulitis. He denies risk factors for HIV or immune system problems, he did have the recent fall with minor head and neck trauma.   Transfer to [facility] for urgent ophthalmology consultation  IV acyclovir  IV Vanc and Levaquin for orbital cellulitis (PCN allergy)  Dilaudid IV for pain control  Topical Decadron eye drops to control local eye inflammation, may need systemic steroids.  HIV antibody pending    Time spent: 70 minutes  [facility] Pager [phone]  If 7PM-7AM, please contact night-coverage [email or web address] Password [facility] 03/17/2012, 3:37 AM

## 2012-03-17 NOTE — ED Notes (Signed)
Received phone call from pharmacy to start vancomycin at an hour and gradually increase it and to closely monitor the patient If these abnormal clinical findings persist, appropriate workup will be completed. The patient understands that follow up is required to elucidate the situation. The meds are started at regular rate the patient may have a bad reaction since the dosage is 1500mg  and is exceptionally high. Will relay this to receiving nurse at [facility]. meds are to be given at 0600

## 2012-03-17 NOTE — ED Provider Notes (Signed)
History     CSN: [id number]  Arrival date & time 03/17/12  0157   First MD Initiated Contact with Patient 03/17/12 [phone]      Chief Complaint  Patient presents with  . Facial Swelling    (Consider location/radiation/quality/duration/timing/severity/associated sxs/prior treatment) HPI Comments: Patient was seen here earlier today for evaluation of facial rash and pain.  Was thought to have had facial cellulitis.  Was prescribed doxycycline and discharged to home.  He returns complaining of severe pain to the left forehead and left eye, now with increased swelling to these areas.  He denies fevers or chills.  No injury or trauma.  The vision is blurry out of the left eye.  The history is provided by the patient.    Past Medical History  Diagnosis Date  . Back pain     History reviewed. No pertinent past surgical history.  No family history on file.  History  Substance Use Topics  . Smoking status: Current Every Day Smoker  . Smokeless tobacco: Not on file  . Alcohol Use: No      Review of Systems  HENT: Positive for facial swelling.   Eyes: Positive for photophobia, pain, discharge and redness.  Neurological: Positive for headaches.  All other systems reviewed and are negative.    Allergies  Doxycycline; Penicillins; and Hydrocodone  Home Medications   Current Outpatient Rx  Name  Route  Sig  Dispense  Refill  . Tetrahydrozoline HCl (VISINE OP)   Ophthalmic   Apply 2 drops to eye 3 (three) times daily as needed (eye redness).         Marland Kitchen doxycycline (VIBRAMYCIN) 100 MG capsule   Oral   Take 1 capsule (100 mg total) by mouth 2 (two) times daily.   20 capsule   0   . traMADol (ULTRAM) 50 MG tablet   Oral   Take 1 tablet (50 mg total) by mouth every 6 (six) hours as needed for pain.   15 tablet   0     There were no vitals taken for this visit.  Physical Exam  Nursing note and vitals reviewed. Constitutional: He is oriented to person, place, and  time. He appears well-developed and well-nourished.  Eyes: EOM are normal. Pupils are equal, round, and reactive to light.  The left forehead and left periorbital region is noted to have a vesicular rash.  There is exquisite ttp.    The left eyelid is swollen.  The left cornea is noted to have what appear to be dendritic lesions with fluorescein staining.     Neck: Normal range of motion. Neck supple.  Cardiovascular: Normal rate and regular rhythm.   Pulmonary/Chest: Effort normal and breath sounds normal.  Musculoskeletal: Normal range of motion.  Neurological: He is alert and oriented to person, place, and time. No cranial nerve deficit. Coordination normal.  Skin: Skin is warm and dry. Rash noted.    ED Course  Procedures (including critical care time)  Labs Reviewed - No data to display No results found.   No diagnosis found.    MDM  This appears to be shingles with corneal involvement and needs iv acyclovir and admission.  I have spoken with Dr. Phillips Odor from [facility] who will see the patient and make arrangements for the transfer.          Geoffery Lyons, MD 03/17/12 [phone]

## 2012-03-17 NOTE — Progress Notes (Addendum)
[facility] PROGRESS NOTE  ADEBAYO ENSMINGER UJW:[id number] DOB: April 05, 1985 DOA: 03/17/2012 PCP: No primary provider on file.  Brief narrative Alex Mullen is a 27 y.o. male without a significant past medical history other a remote left maxilla/zygomatic facial fracture in 2006 and a recent accidental fall 5 days ago where he sprained his back, neck and fractured his left 4th and 5th toes. He was [facility] in the [facility] on [facility]/18 and discharged home with an oral antibiotic (Doxycycline) for what was felt to be a superficial facial skin infection but he returned to the ED this evening continued to get much worse with an eruption of vesicles across his left forehead, external ear and then had worsening redness and swelling around his eye with lid droop, intense eye pain and blurry vision loss in the left eye. Lesions are intensely painful. He denies any fever or chills, no exposures to shingle or chickenpox. He denies any focal neurological symptoms. Due to concern for herpes zoster ophthalmicus and need for ophthalmology consultation, patient was transferred to [facility] on 2/19.   Assessment/Plan: 1. Herpes zoster ophthalmicus, left eye: Discussed with Dr. Fawn Kirk, ophthalmologist on call who indicated that he would not be able to provide inpatient consultation due to lack of appropriate equipment to perform eye exam and advised that we were doing all that was indicated. He did recommend discontinuing steroid eyedrops, continue retroviral treatment, use artificial tears and will see in office as soon as patient is discharged from hospital. ID consulted. Continue contact and airborne isolation. Continue IV acyclovir and antibiotics (IV vancomycin and Levaquin for possible secondary infection).  Pain management. Follow HIV results   Code Status: Full  Family Communication: Discussed with patient's wife and mother at bedside.  Disposition Plan: Home when medically stable.     Consultants:  Discussed with ophthalmology, Dr. Fawn Kirk on 2/19.   ID  Procedures:  None  Antibiotics:  IV vancomycin 2/19 >  IV Levaquin 2/90 >  IV acyclovir 2/19 >  HPI/Subjective: Complains of pain, 9-10/10 left side of head, ear and around left eye. Mild blurred vision left eye but without diplopia.  Objective: Filed Vitals:   03/17/12 0359 03/17/12 0433 03/17/12 0500 03/17/12 0656  BP: 137/87 132/72  134/79  Pulse: 75 69  59  Temp: 98 F (36.7 C)   97.7 F (36.5 C)  TempSrc: Oral   Oral  Resp: 20 20  20   Height:   5\' 6"  (1.676 m) 5\' 9"  (1.753 m)  Weight:   88.451 kg (195 lb) 89.6 kg (197 lb 8.5 oz)  SpO2: 98% 96%  98%    Intake/Output Summary (Last 24 hours) at 03/17/12 0920 Last data filed at 03/17/12 0517  Gross per 24 hour  Intake      0 ml  Output    300 ml  Net   -300 ml   Filed Weights   03/17/12 0319 03/17/12 0500 03/17/12 0656  Weight: 88.451 kg (195 lb) 88.451 kg (195 lb) 89.6 kg (197 lb 8.5 oz)    Exam:   General exam:  in painful distress.   HEENT: Multiple dry vesicles seen left side of forehead. Patchy swelling, redness and tenderness in left periorbital area, left side of neck. Mild left conjunctival congestion but no photophobia. Pupils equally reacting to light and accommodation. Visual acute seems normal. Due to periorbital edema, left eye appears small but no true ptosis. No discharge.   Respiratory system: Clear. No increased work of  breathing.  Cardiovascular system: S1 & S2 heard, RRR. No JVD, murmurs, gallops, clicks or pedal edema.  Gastrointestinal system: Abdomen is nondistended, soft and nontender. Normal bowel sounds heard.  Central nervous system: Alert and oriented. No focal neurological deficits.  Extremities: Symmetric 5 x 5 power.   Data Reviewed: Basic Metabolic Panel:  Recent Labs Lab 03/16/12 1228  NA 137  K 4.3  CL 100  CO2 25  GLUCOSE 107*  BUN 8  CREATININE 0.92  CALCIUM 9.3   Liver  Function Tests: No results found for this basename: AST, ALT, ALKPHOS, BILITOT, PROT, ALBUMIN,  in the last 168 hours No results found for this basename: LIPASE, AMYLASE,  in the last 168 hours No results found for this basename: AMMONIA,  in the last 168 hours CBC:  Recent Labs Lab 03/16/12 1228  WBC 8.5  NEUTROABS 6.1  HGB 15.4  HCT 42.1  MCV 89.0  PLT 205   Cardiac Enzymes: No results found for this basename: CKTOTAL, CKMB, CKMBINDEX, TROPONINI,  in the last 168 hours BNP (last 3 results) No results found for this basename: PROBNP,  in the last 8760 hours CBG: No results found for this basename: GLUCAP,  in the last 168 hours  No results found for this or any previous visit (from the past 240 hour(s)).   Studies: No results found.   Additional labs:   Scheduled Meds: . acyclovir  10 mg/kg Intravenous Q8H  . artificial tears   Left Eye Q4H  . levofloxacin (LEVAQUIN) IV  750 mg Intravenous Q24H  . sodium chloride  3 mL Intravenous Q12H   Continuous Infusions:   Principal Problem:   Ophthalmic herpes zoster infection Active Problems:   Herpes zoster keratitis   Orbital cellulitis   Toe fracture, left    Time spent: 40 minutes.    [facility]  [facility] Pager [phone].   If 8PM-8AM, please contact night-coverage at [email or web address], password [facility] 03/17/2012, 9:20 AM  LOS: 0 days

## 2012-03-17 NOTE — ED Notes (Signed)
Seen here yesterday and diagnosed with bacterial infection of the face.  Pt was given RX for antibiotics and tramadol.  Pt  States he cannot take the antibiotic given because of allergy to same.  Pt states he did not get the tramadol filled and now returns because the swelling and pain is worse.

## 2012-03-17 NOTE — Progress Notes (Signed)
ANTIBIOTIC CONSULT NOTE - INITIAL  Pharmacy Consult for vancomycin Indication: peri-orbital cellulitis  Allergies  Allergen Reactions  . Doxycycline   . Penicillins Other (See Comments)    Childhood Allergy  . Hydrocodone Rash    Makes him "red"    Patient Measurements: Weight: 195 lb (88.451 kg) Adjusted Body Weight: - not known without ht data  Vital Signs:   Intake/Output from previous day:   Intake/Output from this shift:    Labs:  Recent Labs  03/16/12 1228  WBC 8.5  HGB 15.4  PLT 205  CREATININE 0.92   The CrCl is unknown because both a height and weight (above a minimum accepted value) are required for this calculation.  Est to be 80-160ml/min until more data available    Microbiology: No results found for this or any previous visit (from the past 720 hour(s)).  Medical History: Past Medical History  Diagnosis Date  . Back pain     Medications:  Scheduled:  . [COMPLETED] fluorescein      . [COMPLETED] HYDROmorphone      . [COMPLETED] tetracaine       Infusions:  . acyclovir    . levofloxacin (LEVAQUIN) IV 750 mg (03/17/12 4098)   Assessment: 70yr male with facial swelling and sx's of peri-orbital infection, possibly zoster involvement  Goal of Therapy:  1.  Initial dose of vancomycin 1gm given approx 15 hr ago x 1 dose 2.  Will need more pt data to determine maintenance regimen, but expected to be 1250mg  Vancomycin q12h 3.  Will need to monitor renal function  Plan:  1.  Vancomycin 1500mg  x dose now 2.  Clinical pharmD to follow-up in am for determining actual maintenance regimen  Scarlett Presto 03/17/2012,3:32 AM

## 2012-03-17 NOTE — Consult Note (Signed)
          [facility]    Date of Admission:  03/17/2012   Day  1 acyclovir        Day  1 vancomycin         Day  1 levofloxacin       Reason for Consult: herpes zoster ophthalmicus    Referring Physician: Dr. Marcellus Scott  Principal Problem:   Ophthalmic herpes zoster infection Active Problems:   Herpes zoster keratitis   Toe fracture, left   . acyclovir  10 mg/kg Intravenous Q8H  . artificial tears   Left Eye Q4H  . ibuprofen  600 mg Oral TID  . levofloxacin (LEVAQUIN) IV  750 mg Intravenous Q24H  . sodium chloride  3 mL Intravenous Q12H    Recommendations: 1. Change acyclovir to oral valacyclovir 1000 mg 3 times daily for 6 more days 2. Discontinue vancomycin and levofloxacin  3. I will sign off now but please call if I can be further assistance while he is here  Assessment: He has herpes zoster ophthalmicus. I do not believe that he has secondary bacterial infection with orbital cellulitis. I will stop his empiric antibiotic therapy and continue antiviral therapy with oral valacyclovir. He needs 7 days of total therapy.    HPI: Alex Mullen is a 27 y.o. male who developed a small blister on the left side of his forehead 3 days ago. He was seen in urgent care and was placed on doxycycline for possible skin infection but add more lesions appear around the left forehead and eyes associated with severe pain. He was seen at [facility] last night where shingles was diagnosed and he was transferred here for ophthalmologic exam. Previously, he has been in good health with exception of a recent fall causing fractures of his left fourth and fifth toes. He does smoke cigarettes.   Review of Systems: Pertinent items are noted in HPI.  Past Medical History  Diagnosis Date  . Back pain   . 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 Shingles     History  Substance Use Topics  . Smoking status: Current Every Day Smoker -- 1.00 packs/day for 10  years    Types: Cigarettes  . Smokeless tobacco: Current User    Types: Snuff  . Alcohol Use: No    History reviewed. No pertinent family history. Allergies  Allergen Reactions  . Doxycycline   . Penicillins Other (See Comments)    Childhood Allergy  . Hydrocodone Rash    Makes him "red"    OBJECTIVE: Blood pressure 111/69, pulse 67, temperature 97.9 F (36.6 C), temperature source Oral, resp. rate 20, height 5\' 9"  (1.753 m), weight 89.6 kg (197 lb 8.5 oz), SpO2 97.00%. General: He is alert and oriented but uncomfortable due to pain Skin: He has vesicles scattered across his left scalp, forehead and eyelid. There swelling of his left eyelids. His pupils are equal and reactive. He has no conjunctival redness. Lungs: Clear Cor: Regular S1 and S2 no murmurs Abdomen: Soft and nontender  Microbiology: No results found for this or any previous visit (from the past 240 hour(s)).  Cliffton Asters, MD [facility] [phone] pager   [phone] cell 03/17/2012, 5:07 PM

## 2012-03-17 NOTE — Progress Notes (Signed)
RN and Consulting civil engineer informed family and pt of need for gown, mask and gloves when in pt room, pt family states they were with him at home so don't see need for gown/mask/gloves, education done for safety of others without compliance. MD also spoke with family. Family states they will wash their hands prior to leaving room.

## 2012-03-18 DIAGNOSIS — F17200 Nicotine dependence, unspecified, uncomplicated: Secondary | ICD-10-CM

## 2012-03-18 DIAGNOSIS — B0230 Zoster ocular disease, unspecified: Secondary | ICD-10-CM

## 2012-03-18 DIAGNOSIS — Z720 Tobacco use: Secondary | ICD-10-CM | POA: Diagnosis present

## 2012-03-18 LAB — CBC
HCT: 40 % (ref 39.0–52.0)
Hemoglobin: 13.8 g/dL (ref 13.0–17.0)
MCH: 31.6 pg (ref 26.0–34.0)
MCHC: 34.5 g/dL (ref 30.0–36.0)
MCV: 91.5 fL (ref 78.0–100.0)
RBC: 4.37 MIL/uL (ref 4.22–5.81)

## 2012-03-18 MED ORDER — HYDROMORPHONE HCL 4 MG PO TABS: 4.0000 mg | ORAL_TABLET | ORAL | Status: DC | PRN

## 2012-03-18 MED ORDER — ARTIFICIAL TEARS OP OINT: TOPICAL_OINTMENT | OPHTHALMIC | Status: DC

## 2012-03-18 MED ORDER — VALACYCLOVIR HCL 1 G PO TABS: 1000.0000 mg | ORAL_TABLET | Freq: Three times a day (TID) | ORAL | Status: DC

## 2012-03-18 MED ORDER — IBUPROFEN 200 MG PO TABS: 600.0000 mg | ORAL_TABLET | Freq: Three times a day (TID) | ORAL | Status: DC

## 2012-03-18 NOTE — Discharge Summary (Signed)
Physician Discharge Summary  DOYL BITTING ZOX:[id number] DOB: 01/10/1986 DOA: 03/17/2012  PCP: No primary provider on file.  Admit date: 03/17/2012 Discharge date: 03/18/2012  Time spent: Greater than 30 minutes  Recommendations for Outpatient Follow-up:  1. [facility], on 03/25/2012 at 4:30 pm. 2. Dr. Fawn Kirk, Opthalmology- call to be seen today after discharge or on Mon 03/22/2012  Discharge Diagnoses:  Principal Problem:   Ophthalmic herpes zoster infection Active Problems:   Herpes zoster keratitis   Toe fracture, left   Discharge Condition: Improved & Stable  Diet recommendation: Regular diet.  Filed Weights   03/17/12 0319 03/17/12 0500 03/17/12 0656  Weight: 88.451 kg (195 lb) 88.451 kg (195 lb) 89.6 kg (197 lb 8.5 oz)    History of present illness:  Alex Mullen is a 27 y.o. male who developed a small blister on the left side of his forehead 3 days ago. He was seen in urgent care and was placed on doxycycline for possible skin infection but had more lesions appear around the left forehead and eyes associated with severe pain. He was seen at [facility] on 2/18 where shingles was diagnosed and he was transferred to [facility] for ophthalmologic exam. Previously, he has been in good health with exception of a recent fall causing fractures of his left fourth and fifth toes. He does smoke cigarettes.   Hospital Course:  1. Herpes Zoster Opthalmicus, left eye: Patient was initially seen at [facility] and was started on IV acyclovir. He was also started on empiric IV vancomycin and Levaquin for presumed superadded cellulitis. He was transferred to [facility] for ophthalmology consultation. When he arrived to [facility], discussed his care with ophthalmologist on call Dr. Fawn Kirk who recommended continuing antiviral treatment and outpatient consultation with him where he could adequately examine his eye (appropriate equipment not available  in Hospital). ID was consulted and switched his antivirals to by mouth Valtrex to complete total 7 days course. They were not convinced of cellulitis and discontinued antibiotics. HIV antibody negative. Patient's pain, swelling and blurring of the left eye have improved. He is advised to followup with Dr. Luciana Axe later today after discharge or on Monday 2/24. Patient verbalizes understanding. 2. Tobacco abuse: Cessation counseled.   Consultants:  Discussed with ophthalmology, Dr. Fawn Kirk on 2/19.  ID-Dr. Cliffton Asters  Procedures:  None  Antibiotics:  IV vancomycin 2/19 > 2/19 IV Levaquin 2/19 > 2/19  IV acyclovir 2/19 >2/19 By mouth Valtrex 2/19 >   Discharge Exam:  Complaints: Pain and swelling around left eye, left side of head have improved-rates it at 7/10. Blurred vision has significantly improved. No double vision.  Filed Vitals:   03/17/12 0656 03/17/12 1433 03/17/12 2054 03/18/12 0542  BP: 134/79 111/69 133/75 125/66  Pulse: 59 67 64 65  Temp: 97.7 F (36.5 C) 97.9 F (36.6 C) 97.7 F (36.5 C) 97.7 F (36.5 C)  TempSrc: Oral Oral Oral Oral  Resp: 20 20 18 20   Height: 5\' 9"  (1.753 m)     Weight: 89.6 kg (197 lb 8.5 oz)     SpO2: 98% 97% 97% 97%    General exam: Comfortable. HEENT: He has vesicles-drying, scattered across his left scalp, forehead and eyelid. There is swelling of his left eyelids, left side of neck and left upper face. His pupils are equal and reactive. He has no conjunctival redness. No photophobia. Respiratory system: Clear. No increased work of breathing.  Cardiovascular system: S1 &  S2 heard, RRR. No JVD, murmurs, gallops, clicks or pedal edema.  Gastrointestinal system: Abdomen is nondistended, soft and nontender. Normal bowel sounds heard.  Central nervous system: Alert and oriented. No focal neurological deficits.  Extremities: Symmetric 5 x 5 power.   Discharge Instructions      Discharge Orders   Future Orders Complete By Expires      Activity as tolerated - No restrictions  As directed     Call MD for:  redness, tenderness, or signs of infection (pain, swelling, redness, odor or green/yellow discharge around incision site)  As directed     Call MD for:  severe uncontrolled pain  As directed     Call MD for:  temperature >100.4  As directed     Call MD for:  As directed     Comments:      Worsening rashes, pain or swelling on face. Worsening vision.    Diet general  As directed         Medication List    TAKE these medications       artificial tears Oint ophthalmic ointment  Apply to eye every 4 (four) hours. Left eye.     HYDROmorphone 4 MG tablet  Commonly known as:  DILAUDID  Take 1 tablet (4 mg total) by mouth every 4 (four) hours as needed for pain.     ibuprofen 200 MG tablet  Commonly known as:  ADVIL,MOTRIN  Take 3 tablets (600 mg total) by mouth 3 (three) times daily. Use for 3-5 days, then stop.     valACYclovir 1000 MG tablet  Commonly known as:  VALTREX  Take 1 tablet (1,000 mg total) by mouth 3 (three) times daily.       Follow-up Information   Follow up with [facility] On 03/25/2012. (4:30 , bring $20 co pay and photo id with medications)    Contact information:   [address] [place] [address]       Follow up with Edmon Crape, MD. (Call office to be seen today after discharge or tomorrow.)    Contact information:   [address] [place] [place] [address] [phone]        The results of significant diagnostics from this hospitalization (including imaging, microbiology, ancillary and laboratory) are listed below for reference.    Significant Diagnostic Studies: No Imaging.  Microbiology: No results found for this or any previous visit (from the past 240 hour(s)).   Labs: Basic Metabolic Panel:  Recent Labs Lab 03/16/12 1228  NA 137  K 4.3  CL 100  CO2 25  GLUCOSE 107*  BUN 8  CREATININE 0.92  CALCIUM 9.3   Liver Function  Tests: No results found for this basename: AST, ALT, ALKPHOS, BILITOT, PROT, ALBUMIN,  in the last 168 hours No results found for this basename: LIPASE, AMYLASE,  in the last 168 hours No results found for this basename: AMMONIA,  in the last 168 hours CBC:  Recent Labs Lab 03/16/12 1228 03/18/12 0628  WBC 8.5 6.3  NEUTROABS 6.1  --   HGB 15.4 13.8  HCT 42.1 40.0  MCV 89.0 91.5  PLT 205 161   Cardiac Enzymes: No results found for this basename: CKTOTAL, CKMB, CKMBINDEX, TROPONINI,  in the last 168 hours BNP: BNP (last 3 results) No results found for this basename: PROBNP,  in the last 8760 hours CBG: No results found for this basename: GLUCAP,  in the last 168 hours  Additional labs:  HIV antibody: Nonreactive   Signed:  Jillienne Egner  [facility] 03/18/2012, 11:27 AM

## 2012-03-18 NOTE — Care Management Note (Signed)
    Page 1 of 1   03/18/2012     10:58:34 AM   CARE MANAGEMENT NOTE 03/18/2012  Patient:  Alex Mullen, Alex Mullen   Account Number:  [id number]  Date Initiated:  03/18/2012  Documentation initiated by:  Letha Cape  Subjective/Objective Assessment:   dx ophthalmic herpes zoster infection  admit- pta indep.     Action/Plan:   Anticipated DC Date:  03/18/2012   Anticipated DC Plan:  HOME/SELF CARE      DC Planning Services  CM consult  MATCH Program      Choice offered to / List presented to:             Status of service:  Completed, signed off Medicare Important Message given?   (If response is "NO", the following Medicare IM given date fields will be blank) Date Medicare IM given:   Date Additional Medicare IM given:    Discharge Disposition:  HOME/SELF CARE  Per UR Regulation:  Reviewed for med. necessity/level of care/duration of stay  If discussed at Long Length of Stay Meetings, dates discussed:    Comments:  03/18/12 10:55 Letha Cape RN, BSN [phone] patient is indep pta.  Patient has transportation at discharge but does not have any insurancel, assisting patient with Match Program to help with medications, informed patient if he uses this on this admission will not be able to use again until  next Feb.  Awaiting scripts from MD.  Will also make a follow up appt for patient at urgent care.

## 2012-03-18 NOTE — Plan of Care (Signed)
Problem: Phase I Progression Outcomes Goal: Initial discharge plan identified Outcome: Completed/Met Date Met:  03/18/12 To return home with wife

## 2012-03-18 NOTE — Progress Notes (Signed)
Alex Mullen discharged Home per MD order.  Discharge instructions reviewed and discussed with the patient, all questions and concerns answered. Copy of instructions and scripts given to patient.  Explained to pt. And wife how to sign up for Mychart.    Medication List    TAKE these medications       artificial tears Oint ophthalmic ointment  Apply to eye every 4 (four) hours. Left eye.     HYDROmorphone 4 MG tablet  Commonly known as:  DILAUDID  Take 1 tablet (4 mg total) by mouth every 4 (four) hours as needed for pain.     ibuprofen 200 MG tablet  Commonly known as:  ADVIL,MOTRIN  Take 3 tablets (600 mg total) by mouth 3 (three) times daily. Use for 3-5 days, then stop.     valACYclovir 1000 MG tablet  Commonly known as:  VALTREX  Take 1 tablet (1,000 mg total) by mouth 3 (three) times daily.        Patients skin is clean, dry and intact, no evidence of skin break down.  Left side of face swollen with reddened areas from shingle. IV site discontinued and catheter remains intact. Site without signs and symptoms of complications. Dressing and pressure applied.  Patient escorted to car by NT ambulating  no distress noted upon discharge.  Laural Benes, Darvis Croft C 03/18/2012 2:56 PM

## 2012-03-26 ENCOUNTER — Encounter (HOSPITAL_COMMUNITY): Payer: Self-pay | Admitting: *Deleted

## 2012-03-26 ENCOUNTER — Emergency Department (HOSPITAL_COMMUNITY)
Admission: EM | Admit: 2012-03-26 | Discharge: 2012-03-26 | Disposition: A | Discharge status: Home / Self Care | Payer: Medicaid Other | Attending: Emergency Medicine | Admitting: Emergency Medicine

## 2012-03-26 DIAGNOSIS — R197 Diarrhea, unspecified: Secondary | ICD-10-CM | POA: Insufficient documentation

## 2012-03-26 DIAGNOSIS — K529 Noninfective gastroenteritis and colitis, unspecified: Secondary | ICD-10-CM

## 2012-03-26 DIAGNOSIS — K5289 Other specified noninfective gastroenteritis and colitis: Secondary | ICD-10-CM | POA: Insufficient documentation

## 2012-03-26 DIAGNOSIS — R109 Unspecified abdominal pain: Secondary | ICD-10-CM | POA: Insufficient documentation

## 2012-03-26 DIAGNOSIS — IMO0001 Reserved for inherently not codable concepts without codable children: Secondary | ICD-10-CM | POA: Insufficient documentation

## 2012-03-26 DIAGNOSIS — M549 Dorsalgia, unspecified: Secondary | ICD-10-CM | POA: Insufficient documentation

## 2012-03-26 DIAGNOSIS — R21 Rash and other nonspecific skin eruption: Secondary | ICD-10-CM | POA: Insufficient documentation

## 2012-03-26 DIAGNOSIS — R509 Fever, unspecified: Secondary | ICD-10-CM | POA: Insufficient documentation

## 2012-03-26 DIAGNOSIS — Z8619 Personal history of other infectious and parasitic diseases: Secondary | ICD-10-CM | POA: Insufficient documentation

## 2012-03-26 DIAGNOSIS — F17200 Nicotine dependence, unspecified, uncomplicated: Secondary | ICD-10-CM | POA: Insufficient documentation

## 2012-03-26 DIAGNOSIS — R51 Headache: Secondary | ICD-10-CM | POA: Insufficient documentation

## 2012-03-26 LAB — CBC WITH DIFFERENTIAL/PLATELET
Basophils Absolute: 0 10*3/uL (ref 0.0–0.1)
Eosinophils Absolute: 0 10*3/uL (ref 0.0–0.7)
Eosinophils Relative: 0 % (ref 0–5)
HCT: 48.5 % (ref 39.0–52.0)
MCH: 32.8 pg (ref 26.0–34.0)
MCHC: 36.5 g/dL — ABNORMAL HIGH (ref 30.0–36.0)
MCV: 89.8 fL (ref 78.0–100.0)
Monocytes Absolute: 0.9 10*3/uL (ref 0.1–1.0)
Platelets: 233 10*3/uL (ref 150–400)
RDW: 13.6 % (ref 11.5–15.5)

## 2012-03-26 LAB — COMPREHENSIVE METABOLIC PANEL
ALT: 47 U/L (ref 0–53)
AST: 26 U/L (ref 0–37)
Calcium: 9.6 mg/dL (ref 8.4–10.5)
Creatinine, Ser: 0.82 mg/dL (ref 0.50–1.35)
GFR calc Af Amer: >90 mL/min (ref >90)
GFR calc non Af Amer: >90 mL/min (ref >90)
Sodium: 135 mEq/L (ref 135–145)
Total Protein: 7.9 g/dL (ref 6.0–8.3)

## 2012-03-26 MED ORDER — ONDANSETRON HCL 4 MG/2ML IJ SOLN
4.0000 mg | Freq: Once | INTRAMUSCULAR | Status: AC
  Administered 2012-03-26: 4 mg via INTRAVENOUS
  Filled 2012-03-26: qty 2

## 2012-03-26 MED ORDER — SODIUM CHLORIDE 0.9 % IV BOLUS (SEPSIS)
1000.0000 mL | Freq: Once | INTRAVENOUS | Status: AC
  Administered 2012-03-26: 1000 mL via INTRAVENOUS

## 2012-03-26 MED ORDER — ONDANSETRON 4 MG PO TBDP: 4.0000 mg | ORAL_TABLET | Freq: Three times a day (TID) | ORAL | Status: DC | PRN

## 2012-03-26 MED ORDER — HYDROMORPHONE HCL PF 1 MG/ML IJ SOLN
1.0000 mg | Freq: Once | INTRAMUSCULAR | Status: AC
  Administered 2012-03-26: 1 mg via INTRAVENOUS
  Filled 2012-03-26: qty 1

## 2012-03-26 MED ORDER — SODIUM CHLORIDE 0.9 % IV SOLN: INTRAVENOUS | Status: DC

## 2012-03-26 NOTE — ED Provider Notes (Signed)
History    Scribed for Shelda Jakes, MD, the patient was seen in room APA05/APA05. This chart was scribed by Lewanda Rife, ED scribe. Patient's care was started at 1959   CSN: [id number]  Arrival date & time 03/26/12  4098   First MD Initiated Contact with Patient 03/26/12 1858      Chief Complaint  Patient presents with  . Emesis    (Consider location/radiation/quality/duration/timing/severity/associated sxs/prior treatment) HPI Alex Mullen is a 27 y.o. male who presents to the Emergency Department complaining of several episodes of emesis acute onset today. Pt reports moderate waxing and waning diarrhea, abdominal pain, subjective fever, chills, headaches, back pain, improving rash on face, and generalized myalgias. Pt denies visual disturbances, sore throat, rhinorrhea, hematuria, chest pain, shortness of breath and cough. Pt reports he has finished his valtrex and was cleared by opthalmology earlier this week. Pt reports taking Tylenol at home with no relief of symptoms.   Hospitalized 03/17/12- 03/18/12  For shingles with involvement of left eye with dendritic lesions   Past Medical History  Diagnosis Date  . Back pain   . 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 Shingles     Past Surgical History  Procedure Laterality Date  . Facial fracture surgery  2006    History reviewed. No pertinent family history.  History  Substance Use Topics  . Smoking status: Current Every Day Smoker -- 1.00 packs/day for 10 years    Types: Cigarettes  . Smokeless tobacco: Current User    Types: Snuff  . Alcohol Use: No      Review of Systems  Constitutional: Positive for fever and chills.  HENT: Negative for sore throat and rhinorrhea.   Eyes: Negative for visual disturbance.  Respiratory: Negative for cough and shortness of breath.   Cardiovascular: Negative for chest pain.  Gastrointestinal: Positive for abdominal pain (mostly left ).  Genitourinary: Negative for  hematuria.  Musculoskeletal: Positive for myalgias and back pain.  Skin: Positive for rash.  Neurological: Positive for headaches.  Hematological: Does not bruise/bleed easily.   A complete 10 system review of systems was obtained and all systems are negative except as noted in the HPI and PMH.   Allergies  Doxycycline; Penicillins; and Hydrocodone  Home Medications   Current Outpatient Rx  Name  Route  Sig  Dispense  Refill  . acetaminophen (TYLENOL) 500 MG tablet   Oral   Take 1,000 mg by mouth every 6 (six) hours as needed for pain.         Marland Kitchen ibuprofen (ADVIL,MOTRIN) 200 MG tablet   Oral   Take 3 tablets (600 mg total) by mouth 3 (three) times daily. Use for 3-5 days, then stop.           BP 143/113  Temp(Src) 98.4 F (36.9 C) (Oral)  Resp 24  Ht 6' (1.829 m)  Wt 200 lb (90.719 kg)  BMI 27.12 kg/m2  SpO2 99%  Physical Exam  Nursing note and vitals reviewed. Constitutional: He is oriented to person, place, and time. He appears well-developed and well-nourished. No distress.  HENT:  Head: Normocephalic and atraumatic.  Mouth/Throat: Oropharynx is clear and moist.  Eyes: Conjunctivae and EOM are normal. Pupils are equal, round, and reactive to light. No scleral icterus.  Neck: Normal range of motion. Neck supple. No tracheal deviation present.  Cardiovascular: Regular rhythm and normal heart sounds.  Tachycardia present.   No murmur heard. Cap refill < 1 second on both big toes  Pulmonary/Chest:  Effort normal. No respiratory distress.  Abdominal: Soft. Bowel sounds are normal. He exhibits no distension. There is tenderness in the suprapubic area. There is no rebound and no guarding.  Musculoskeletal: Normal range of motion. He exhibits no edema.  Neurological: He is alert and oriented to person, place, and time.  Skin: Skin is warm and dry. Rash noted.  Rash on left side of face, left forehead and left eyelid erythematous scabbed vesicles   Psychiatric: He has a  normal mood and affect. His behavior is normal.    ED Course  Procedures (including critical care time) Medications  0.9 %  sodium chloride infusion (not administered)  sodium chloride 0.9 % bolus 1,000 mL (1,000 mLs Intravenous New Bag/Given 03/26/12 2019)  ondansetron (ZOFRAN) injection 4 mg (4 mg Intravenous Given 03/26/12 2019)  HYDROmorphone (DILAUDID) injection 1 mg (1 mg Intravenous Given 03/26/12 2019)  ondansetron (ZOFRAN) injection 4 mg (4 mg Intravenous Given 03/26/12 2112)  HYDROmorphone (DILAUDID) injection 1 mg (1 mg Intravenous Given 03/26/12 2111)    Labs Reviewed  CBC WITH DIFFERENTIAL - Abnormal; Notable for the following:    WBC 16.8 (*)    Hemoglobin 17.7 (*)    MCHC 36.5 (*)    Neutrophils Relative 90 (*)    Neutro Abs 15.1 (*)    Lymphocytes Relative 5 (*)    All other components within normal limits  COMPREHENSIVE METABOLIC PANEL - Abnormal; Notable for the following:    Glucose, Bld 113 (*)    All other components within normal limits  LIPASE, BLOOD   No results found. Results for orders placed during the hospital encounter of 03/26/12  CBC WITH DIFFERENTIAL      Result Value Range   WBC 16.8 (*) 4.0 - 10.5 K/uL   RBC 5.40  4.22 - 5.81 MIL/uL   Hemoglobin 17.7 (*) 13.0 - 17.0 g/dL   HCT 16.1  09.6 - 04.5 %   MCV 89.8  78.0 - 100.0 fL   MCH 32.8  26.0 - 34.0 pg   MCHC 36.5 (*) 30.0 - 36.0 g/dL   RDW 40.9  81.1 - 91.4 %   Platelets 233  150 - 400 K/uL   Neutrophils Relative 90 (*) 43 - 77 %   Neutro Abs 15.1 (*) 1.7 - 7.7 K/uL   Lymphocytes Relative 5 (*) 12 - 46 %   Lymphs Abs 0.8  0.7 - 4.0 K/uL   Monocytes Relative 5  3 - 12 %   Monocytes Absolute 0.9  0.1 - 1.0 K/uL   Eosinophils Relative 0  0 - 5 %   Eosinophils Absolute 0.0  0.0 - 0.7 K/uL   Basophils Relative 0  0 - 1 %   Basophils Absolute 0.0  0.0 - 0.1 K/uL  COMPREHENSIVE METABOLIC PANEL      Result Value Range   Sodium 135  135 - 145 mEq/L   Potassium 4.0  3.5 - 5.1 mEq/L   Chloride  97  96 - 112 mEq/L   CO2 22  19 - 32 mEq/L   Glucose, Bld 113 (*) 70 - 99 mg/dL   BUN 15  6 - 23 mg/dL   Creatinine, Ser 7.82  0.50 - 1.35 mg/dL   Calcium 9.6  8.4 - 95.6 mg/dL   Total Protein 7.9  6.0 - 8.3 g/dL   Albumin 4.7  3.5 - 5.2 g/dL   AST 26  0 - 37 U/L   ALT 47  0 - 53 U/L  Alkaline Phosphatase 83  39 - 117 U/L   Total Bilirubin 0.7  0.3 - 1.2 mg/dL   GFR calc non Af Amer >90  >90 mL/min   GFR calc Af Amer >90  >90 mL/min  LIPASE, BLOOD      Result Value Range   Lipase 21  11 - 59 U/L      1. Gastroenteritis       MDM  Symptoms most consistent with a gastroenteritis acute onset of vomiting and diarrhea. Patient responded very rapidly to 1 mg of the dilaudid raising some question of withdrawal symptoms is the main cause of his symptomatology. However patient will be treated with Zofran was hydrated here in the emergency department. All symptoms went away completely after being given 1 mg of hydromorphone.      I personally performed the services described in this documentation, which was scribed in my presence. The recorded information has been reviewed and is accurate.     Shelda Jakes, MD 03/26/12 2115

## 2012-03-26 NOTE — ED Notes (Addendum)
Vomiting, diarrhea, fever, back pack, Recent shingles dx with adm to [facility].   abd  pain

## 2012-03-26 NOTE — ED Notes (Signed)
MD at bedside. EDP discussing plan of care and examining said pt at this time.

## 2012-03-26 NOTE — ED Notes (Signed)
Pt presents with multiple c/o pain and emesis. Pt reports was hospitalized for shingles this past week, and has not recovered,Noted scabs on pt's forehead and around left eye,  emesis since 0630 this morning and multiple times during day per pt,  Headache , lower back pain and left side pain and chills. No emesis noted at this time. Pt denies diarrhea, fever. Pt is very restless and hyperventilates at times. BBS clear and equal at this time. Pt requires frequent reminders and cues to slow breathing. NAD noted at this time.

## 2012-06-22 ENCOUNTER — Encounter (HOSPITAL_COMMUNITY): Payer: Self-pay | Admitting: Emergency Medicine

## 2012-06-22 ENCOUNTER — Emergency Department (HOSPITAL_COMMUNITY)
Admission: EM | Admit: 2012-06-22 | Discharge: 2012-06-22 | Disposition: A | Discharge status: Home / Self Care | Payer: Medicaid Other | Attending: Emergency Medicine | Admitting: Emergency Medicine

## 2012-06-22 DIAGNOSIS — Z8719 Personal history of other diseases of the digestive system: Secondary | ICD-10-CM | POA: Insufficient documentation

## 2012-06-22 DIAGNOSIS — Z8619 Personal history of other infectious and parasitic diseases: Secondary | ICD-10-CM | POA: Insufficient documentation

## 2012-06-22 DIAGNOSIS — F17200 Nicotine dependence, unspecified, uncomplicated: Secondary | ICD-10-CM | POA: Insufficient documentation

## 2012-06-22 DIAGNOSIS — Z8781 Personal history of (healed) traumatic fracture: Secondary | ICD-10-CM | POA: Insufficient documentation

## 2012-06-22 DIAGNOSIS — Z880 Allergy status to penicillin: Secondary | ICD-10-CM | POA: Insufficient documentation

## 2012-06-22 DIAGNOSIS — K047 Periapical abscess without sinus: Secondary | ICD-10-CM

## 2012-06-22 MED ORDER — OXYCODONE-ACETAMINOPHEN 5-325 MG PO TABS: 1.0000 | ORAL_TABLET | ORAL | Status: DC | PRN

## 2012-06-22 MED ORDER — CLINDAMYCIN HCL 150 MG PO CAPS
150.0000 mg | ORAL_CAPSULE | Freq: Once | ORAL | Status: AC
  Administered 2012-06-22: 150 mg via ORAL
  Filled 2012-06-22: qty 1

## 2012-06-22 MED ORDER — HYDROCODONE-ACETAMINOPHEN 5-325 MG PO TABS
1.0000 | ORAL_TABLET | Freq: Once | ORAL | Status: AC
  Administered 2012-06-22: 1 via ORAL
  Filled 2012-06-22: qty 1

## 2012-06-22 MED ORDER — DIPHENHYDRAMINE HCL 25 MG PO CAPS
25.0000 mg | ORAL_CAPSULE | Freq: Once | ORAL | Status: AC
  Administered 2012-06-22: 25 mg via ORAL
  Filled 2012-06-22: qty 1

## 2012-06-22 MED ORDER — CLINDAMYCIN HCL 150 MG PO CAPS: 150.0000 mg | ORAL_CAPSULE | Freq: Four times a day (QID) | ORAL | Status: DC

## 2012-06-22 MED ORDER — HYDROCODONE-ACETAMINOPHEN 5-325 MG PO TABS: 1.0000 | ORAL_TABLET | ORAL | Status: DC | PRN

## 2012-06-22 NOTE — ED Notes (Signed)
Pt c/o rt sided dental pain x one week.

## 2012-06-23 NOTE — ED Provider Notes (Signed)
Medical screening examination/treatment/procedure(s) were performed by non-physician practitioner and as supervising physician I was immediately available for consultation/collaboration.  Lashaun Krapf R. Da Authement, MD 06/23/12 2332 

## 2012-06-23 NOTE — ED Provider Notes (Signed)
History     CSN: [id number]  Arrival date & time 06/22/12  2115   First MD Initiated Contact with Patient 06/22/12 2228      Chief Complaint  Patient presents with  . Dental Pain    (Consider location/radiation/quality/duration/timing/severity/associated sxs/prior treatment) HPI Comments: KAMRAN COKER is a 27 y.o. Male with a 1 week history of increasing dental pain and gingival swelling.   The patient has a history of decay of the tooth involved which has recently started to cause pain.  There has been no fevers,  Chills, nausea or vomiting, also no complaint of difficulty swallowing,  Although chewing makes pain worse.  The patient has tried tylenol and motrin without relief of symptoms.  He has scheduled an appointment with a dentist on [place] in 7 days.       The history is provided by the patient.    Past Medical History  Diagnosis Date  . Back pain   . 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 Shingles     Past Surgical History  Procedure Laterality Date  . Facial fracture surgery  2006    History reviewed. No pertinent family history.  History  Substance Use Topics  . Smoking status: Current Every Day Smoker -- 1.00 packs/day for 10 years    Types: Cigarettes  . Smokeless tobacco: Current User    Types: Snuff  . Alcohol Use: No      Review of Systems  Constitutional: Negative for fever.  HENT: Positive for dental problem. Negative for sore throat, facial swelling, neck pain and neck stiffness.   Respiratory: Negative for shortness of breath.     Allergies  Doxycycline; Penicillins; Hydrocodone; and Robaxin  Home Medications   Current Outpatient Rx  Name  Route  Sig  Dispense  Refill  . acetaminophen (TYLENOL) 500 MG tablet   Oral   Take 1,000 mg by mouth every 6 (six) hours as needed for pain.         Marland Kitchen ibuprofen (ADVIL,MOTRIN) 200 MG tablet   Oral   Take 600 mg by mouth every 8 (eight) hours as needed.         Marland Kitchen oxyCODONE-acetaminophen  (PERCOCET/ROXICET) 5-325 MG per tablet   Oral   Take 1 tablet by mouth once as needed for pain.         . clindamycin (CLEOCIN) 150 MG capsule   Oral   Take 1 capsule (150 mg total) by mouth every 6 (six) hours.   28 capsule   0   . oxyCODONE-acetaminophen (PERCOCET/ROXICET) 5-325 MG per tablet   Oral   Take 1 tablet by mouth every 4 (four) hours as needed for pain.   15 tablet   0     BP 144/83  Pulse 95  Temp(Src) 98.6 F (37 C) (Oral)  Resp 18  Ht 6' (1.829 m)  Wt 200 lb (90.719 kg)  BMI 27.12 kg/m2  SpO2 98%  Physical Exam  Constitutional: He is oriented to person, place, and time. He appears well-developed and well-nourished. No distress.  HENT:  Head: Normocephalic and atraumatic. No trismus in the jaw.  Right Ear: Tympanic membrane and external ear normal.  Left Ear: Tympanic membrane and external ear normal.  Mouth/Throat: Oropharynx is clear and moist and mucous membranes are normal. No oral lesions. Dental abscesses present.    Eyes: Conjunctivae are normal.  Neck: Normal range of motion. Neck supple.  Cardiovascular: Normal rate and normal heart sounds.   Pulmonary/Chest: Effort normal.  Abdominal: He exhibits no distension.  Musculoskeletal: Normal range of motion.  Lymphadenopathy:    He has no cervical adenopathy.  Neurological: He is alert and oriented to person, place, and time.  Skin: Skin is warm and dry. No erythema.  Psychiatric: He has a normal mood and affect.    ED Course  Procedures (including critical care time)  Labs Reviewed - No data to display No results found.   1. Dental abscess       MDM  Pt prescribed clindamycin, first dose given here.  Oxycodone #15, then advised ibuprofen if pain persists after oxycodone completed.  Pt to see his dentist in 1 week, encouraged recheck sooner  for worsened sx, swelling,  Fevers.  The patient appears reasonably screened and/or stabilized for discharge and I doubt any other medical  condition or other [facility] requiring further screening, evaluation, or treatment in the ED at this time prior to discharge.         Burgess Amor, PA-C 06/23/12 1406

## 2012-06-24 ENCOUNTER — Encounter (HOSPITAL_COMMUNITY): Payer: Self-pay | Admitting: *Deleted

## 2012-06-24 ENCOUNTER — Emergency Department (HOSPITAL_COMMUNITY)
Admission: EM | Admit: 2012-06-24 | Discharge: 2012-06-24 | Disposition: A | Discharge status: Home / Self Care | Payer: Medicaid Other | Attending: Emergency Medicine | Admitting: Emergency Medicine

## 2012-06-24 DIAGNOSIS — Z880 Allergy status to penicillin: Secondary | ICD-10-CM | POA: Insufficient documentation

## 2012-06-24 DIAGNOSIS — F17200 Nicotine dependence, unspecified, uncomplicated: Secondary | ICD-10-CM | POA: Insufficient documentation

## 2012-06-24 DIAGNOSIS — Z8781 Personal history of (healed) traumatic fracture: Secondary | ICD-10-CM | POA: Insufficient documentation

## 2012-06-24 DIAGNOSIS — K0889 Other specified disorders of teeth and supporting structures: Secondary | ICD-10-CM

## 2012-06-24 DIAGNOSIS — K089 Disorder of teeth and supporting structures, unspecified: Secondary | ICD-10-CM | POA: Insufficient documentation

## 2012-06-24 DIAGNOSIS — Z8619 Personal history of other infectious and parasitic diseases: Secondary | ICD-10-CM | POA: Insufficient documentation

## 2012-06-24 NOTE — ED Provider Notes (Signed)
History    This chart was scribed for Sunnie Nielsen, MD by Marlyne Beards, ED Scribe. The patient was seen in room APA06/APA06. Patient's care was started at 2259.    CSN: [id number]  Arrival date & time 06/24/12  2259   First MD Initiated Contact with Patient 06/24/12 2303      Chief Complaint  Patient presents with  . Dental Pain    (Consider location/radiation/quality/duration/timing/severity/associated sxs/prior treatment) Patient is a 27 y.o. male presenting with tooth pain.  Dental Pain  HPI Comments: ALBION WEATHERHOLTZ is a 27 y.o. male who presents to the Emergency Department complaining of moderate constant left sided upper dental pain which started the 27 of May. Pt was seen here on the 27 of May and he states that the pain has gotten progressively worse.  He states that he has a crack in his upper left molar. Pt states that he was given a work note on his previous visit but still is unable to work due to the condition he is in. Pt denies any trouble breathing/swallowing, fever, chills, cough, nausea, vomiting, diarrhea, SOB, weakness, and any other associated symptoms.   Past Medical History  Diagnosis Date  . Back pain   . 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 Shingles     Past Surgical History  Procedure Laterality Date  . Facial fracture surgery  2006    No family history on file.  History  Substance Use Topics  . Smoking status: Current Every Day Smoker -- 1.00 packs/day for 10 years    Types: Cigarettes  . Smokeless tobacco: Current User    Types: Snuff  . Alcohol Use: No      Review of Systems  Allergies  Doxycycline; Penicillins; Hydrocodone; and Robaxin  Home Medications   Current Outpatient Rx  Name  Route  Sig  Dispense  Refill  . acetaminophen (TYLENOL) 500 MG tablet   Oral   Take 1,000 mg by mouth every 6 (six) hours as needed for pain.         . clindamycin (CLEOCIN) 150 MG capsule   Oral   Take 1 capsule (150 mg total) by mouth every 6  (six) hours.   28 capsule   0   . ibuprofen (ADVIL,MOTRIN) 200 MG tablet   Oral   Take 600 mg by mouth every 8 (eight) hours as needed.         Marland Kitchen oxyCODONE-acetaminophen (PERCOCET/ROXICET) 5-325 MG per tablet   Oral   Take 1 tablet by mouth once as needed for pain.         Marland Kitchen oxyCODONE-acetaminophen (PERCOCET/ROXICET) 5-325 MG per tablet   Oral   Take 1 tablet by mouth every 4 (four) hours as needed for pain.   15 tablet   0     BP 136/73  Temp(Src) 98.9 F (37.2 C) (Oral)  Resp 20  Ht 6' (1.829 m)  Wt 200 lb (90.719 kg)  BMI 27.12 kg/m2  SpO2 97%  Physical Exam  Nursing note and vitals reviewed. Constitutional: He is oriented to person, place, and time. He appears well-developed and well-nourished. No distress.  HENT:  Head: Normocephalic and atraumatic.  Mouth/Throat: Uvula is midline.  Right upper first molar tenderness. right sided facial swelling with moderate trismus.  Eyes: EOM are normal.  Neck: Neck supple. No tracheal deviation present.  Cardiovascular: Normal rate, regular rhythm and normal heart sounds.   Pulmonary/Chest: Effort normal. No respiratory distress.  Musculoskeletal: Normal range of motion.  Neurological: He  is alert and oriented to person, place, and time.  Skin: Skin is warm and dry.  Psychiatric: He has a normal mood and affect. His behavior is normal.    ED Course  Dental Date/Time: 06/24/2012 11:35 PM Performed by: Sunnie Nielsen Authorized by: Sunnie Nielsen Consent: Verbal consent obtained. Risks and benefits: risks, benefits and alternatives were discussed Consent given by: patient Patient understanding: patient states understanding of the procedure being performed Patient consent: the patient's understanding of the procedure matches consent given Procedure consent: procedure consent matches procedure scheduled Required items: required blood products, implants, devices, and special equipment available Patient identity confirmed:  verbally with patient Time out: Immediately prior to procedure a "time out" was called to verify the correct patient, procedure, equipment, support staff and site/side marked as required. Preparation: Patient was prepped and draped in the usual sterile fashion. Local anesthesia used: yes Anesthesia: local infiltration Local anesthetic: bupivacaine 0.5% without epinephrine Anesthetic total: 1.8 ml Comments: R upper first molar injected with 28G needle   (including critical care time) DIAGNOSTIC STUDIES: Oxygen Saturation is 97% on room air, normal by my interpretation.    COORDINATION OF CARE: 11:16 PM Discussed ED treatment with pt and pt agrees.      MDM  Dental pain - needs work note  Dental block  Has DS f/u, ABx and oxycodone - will continue the same.       I personally performed the services described in this documentation, which was scribed in my presence. The recorded information has been reviewed and is accurate.     Sunnie Nielsen, MD 06/24/12 2340

## 2012-06-24 NOTE — ED Notes (Signed)
Pt seen & treated 2 days ago. States here tonight to get a work note, he can not work the way his tooth is hurting. Pt states he did not want one when he was here on the 27th.

## 2012-06-24 NOTE — ED Notes (Signed)
Pt alert & oriented x4, stable gait. Patient given discharge instructions, paperwork & prescription(s). Patient  instructed to stop at the registration desk to finish any additional paperwork. Patient verbalized understanding. Pt left department w/ no further questions. 

## 2012-06-24 NOTE — ED Notes (Signed)
EDP given dental box.

## 2012-11-19 ENCOUNTER — Emergency Department (HOSPITAL_COMMUNITY)
Admission: EM | Admit: 2012-11-19 | Discharge: 2012-11-19 | Disposition: A | Discharge status: Home / Self Care | Payer: Medicaid Other | Attending: Emergency Medicine | Admitting: Emergency Medicine

## 2012-11-19 ENCOUNTER — Encounter (HOSPITAL_COMMUNITY): Payer: Self-pay | Admitting: Emergency Medicine

## 2012-11-19 DIAGNOSIS — Z880 Allergy status to penicillin: Secondary | ICD-10-CM | POA: Insufficient documentation

## 2012-11-19 DIAGNOSIS — M5431 Sciatica, right side: Secondary | ICD-10-CM

## 2012-11-19 DIAGNOSIS — Z8619 Personal history of other infectious and parasitic diseases: Secondary | ICD-10-CM | POA: Insufficient documentation

## 2012-11-19 DIAGNOSIS — F17200 Nicotine dependence, unspecified, uncomplicated: Secondary | ICD-10-CM | POA: Insufficient documentation

## 2012-11-19 DIAGNOSIS — M5430 Sciatica, unspecified side: Secondary | ICD-10-CM | POA: Insufficient documentation

## 2012-11-19 DIAGNOSIS — G8929 Other chronic pain: Secondary | ICD-10-CM | POA: Insufficient documentation

## 2012-11-19 MED ORDER — KETOROLAC TROMETHAMINE 10 MG PO TABS
10.0000 mg | ORAL_TABLET | Freq: Once | ORAL | Status: DC
  Filled 2012-11-19: qty 1

## 2012-11-19 MED ORDER — PREDNISONE 50 MG PO TABS
60.0000 mg | ORAL_TABLET | Freq: Once | ORAL | Status: AC
  Administered 2012-11-19: 60 mg via ORAL
  Filled 2012-11-19 (×2): qty 1

## 2012-11-19 MED ORDER — DIAZEPAM 5 MG PO TABS
10.0000 mg | ORAL_TABLET | Freq: Once | ORAL | Status: AC
  Administered 2012-11-19: 10 mg via ORAL
  Filled 2012-11-19: qty 2

## 2012-11-19 NOTE — ED Provider Notes (Signed)
CSN: [id number]     Arrival date & time 11/19/12  1120 History   None    Chief Complaint  Patient presents with  . Back Pain   (Consider location/radiation/quality/duration/timing/severity/associated sxs/prior Treatment) HPI Comments: Pt c/o right hip/lower back  pain that radiates to the right leg.  Patient is a 27 y.o. male presenting with back pain. The history is provided by the patient.  Back Pain Location:  Lumbar spine Quality:  Aching and shooting Radiates to:  R thigh Pain severity:  Moderate Pain is:  Same all the time Onset quality:  Gradual Timing:  Constant Progression:  Worsening Chronicity:  Chronic Relieved by:  Nothing Worsened by:  Movement and palpation Ineffective treatments:  Ibuprofen Associated symptoms: no abdominal pain, no bladder incontinence, no bowel incontinence, no chest pain, no dysuria and no perianal numbness     Past Medical History  Diagnosis Date  . Back pain   . 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 Shingles    Past Surgical History  Procedure Laterality Date  . Facial fracture surgery  2006   No family history on file. History  Substance Use Topics  . Smoking status: Current Every Day Smoker -- 1.00 packs/day for 10 years    Types: Cigarettes  . Smokeless tobacco: Current User    Types: Snuff  . Alcohol Use: No    Review of Systems  Constitutional: Negative for activity change.       All ROS Neg except as noted in HPI  HENT: Negative for nosebleeds.   Eyes: Negative for photophobia and discharge.  Respiratory: Negative for cough, shortness of breath and wheezing.   Cardiovascular: Negative for chest pain and palpitations.  Gastrointestinal: Negative for abdominal pain, blood in stool and bowel incontinence.  Genitourinary: Negative for bladder incontinence, dysuria, frequency and hematuria.  Musculoskeletal: Positive for back pain. Negative for arthralgias and neck pain.  Skin: Negative.   Neurological: Negative for dizziness,  seizures and speech difficulty.  Psychiatric/Behavioral: Negative for hallucinations and confusion.    Allergies  Doxycycline; Penicillins; Hydrocodone; and Robaxin  Home Medications   Current Outpatient Rx  Name  Route  Sig  Dispense  Refill  . acetaminophen (TYLENOL) 500 MG tablet   Oral   Take 1,000 mg by mouth every 6 (six) hours as needed for pain.         Marland Kitchen ibuprofen (ADVIL,MOTRIN) 200 MG tablet   Oral   Take 600 mg by mouth every 8 (eight) hours as needed for pain.           BP 133/83  Pulse 96  Temp(Src) 97.8 F (36.6 C) (Oral)  Resp 20  Ht 5\' 9"  (1.753 m)  Wt 180 lb (81.647 kg)  BMI 26.57 kg/m2  SpO2 98% Physical Exam  Nursing note and vitals reviewed. Constitutional: He is oriented to person, place, and time. He appears well-developed and well-nourished.  Non-toxic appearance.  HENT:  Head: Normocephalic.  Right Ear: Tympanic membrane and external ear normal.  Left Ear: Tympanic membrane and external ear normal.  Eyes: EOM and lids are normal. Pupils are equal, round, and reactive to light.  Neck: Normal range of motion. Neck supple. Carotid bruit is not present.  Cardiovascular: Normal rate, regular rhythm, normal heart sounds, intact distal pulses and normal pulses.   Pulmonary/Chest: Breath sounds normal. No respiratory distress.  Abdominal: Soft. Bowel sounds are normal. There is no tenderness. There is no guarding.  Musculoskeletal: Normal range of motion.  Arms: Lymphadenopathy:       Head (right side): No submandibular adenopathy present.       Head (left side): No submandibular adenopathy present.    He has no cervical adenopathy.  Neurological: He is alert and oriented to person, place, and time. He has normal strength. No cranial nerve deficit or sensory deficit.  Skin: Skin is warm and dry.  Psychiatric: He has a normal mood and affect. His speech is normal.    ED Course  Procedures (including critical care time) Labs Review Labs  Reviewed - No data to display Imaging Review No results found.  EKG Interpretation   None       MDM  No diagnosis found. *I have reviewed nursing notes, vital signs, and all appropriate lab and imaging results for this patient.**  Patient has history of back problems. Patient has a history of pain to palpation and attempted range of motion of the lower lumbar area. Patient treated in the emergency department with prednisone, Valium, and Toradol.  Patient refused the Toradol. He request oxycodone, but this was not given for chronic pain. Patient was advised to see his primary physician or clinic for pain management.  Kathie Dike, PA-C 11/21/12 1816

## 2012-11-19 NOTE — ED Notes (Signed)
Pt refused toradol stated "that don't work for me, they usually give me oxycodone", when rn entered room pt was fighting w/ girlfriend and she stated "are you already on something", the pt stated, "why  Don't you just leave', rn then asked if that was the pts ride home, and he stated yes and then asked the male friend to "just stay."

## 2012-11-19 NOTE — ED Notes (Signed)
Pain in right hip radiating down right leg

## 2012-11-19 NOTE — ED Notes (Signed)
Pt walked out to desk, stated he was ready to go, "if you ain't gonna do nothing else for me", spoke to bryant, pa, when asked if he wanted any of the scripts that were going to be prescribed he stated "NO, i'll just get the meds I need off the streets".  Pt signed for d/c papers and walked out unaided.

## 2012-11-24 NOTE — ED Provider Notes (Signed)
Medical screening examination/treatment/procedure(s) were performed by non-physician practitioner and as supervising physician I was immediately available for consultation/collaboration.  Won Kreuzer L Zeus Marquis, MD 11/24/12 1631 

## 2012-12-13 ENCOUNTER — Ambulatory Visit: Payer: Self-pay | Admitting: Family Medicine

## 2013-01-22 ENCOUNTER — Emergency Department (HOSPITAL_COMMUNITY)
Admission: EM | Admit: 2013-01-22 | Discharge: 2013-01-22 | Disposition: A | Discharge status: Home / Self Care | Payer: Medicaid Other | Attending: Emergency Medicine | Admitting: Emergency Medicine

## 2013-01-22 ENCOUNTER — Encounter (HOSPITAL_COMMUNITY): Payer: Self-pay | Admitting: Emergency Medicine

## 2013-01-22 ENCOUNTER — Emergency Department (HOSPITAL_COMMUNITY): Payer: Medicaid Other

## 2013-01-22 DIAGNOSIS — Y9301 Activity, walking, marching and hiking: Secondary | ICD-10-CM | POA: Insufficient documentation

## 2013-01-22 DIAGNOSIS — X500XXA Overexertion from strenuous movement or load, initial encounter: Secondary | ICD-10-CM | POA: Insufficient documentation

## 2013-01-22 DIAGNOSIS — S93402A Sprain of unspecified ligament of left ankle, initial encounter: Secondary | ICD-10-CM

## 2013-01-22 DIAGNOSIS — S93409A Sprain of unspecified ligament of unspecified ankle, initial encounter: Secondary | ICD-10-CM | POA: Insufficient documentation

## 2013-01-22 DIAGNOSIS — Z880 Allergy status to penicillin: Secondary | ICD-10-CM | POA: Insufficient documentation

## 2013-01-22 DIAGNOSIS — Z8619 Personal history of other infectious and parasitic diseases: Secondary | ICD-10-CM | POA: Insufficient documentation

## 2013-01-22 DIAGNOSIS — F17200 Nicotine dependence, unspecified, uncomplicated: Secondary | ICD-10-CM | POA: Insufficient documentation

## 2013-01-22 DIAGNOSIS — Y9289 Other specified places as the place of occurrence of the external cause: Secondary | ICD-10-CM | POA: Insufficient documentation

## 2013-01-22 DIAGNOSIS — S99912A Unspecified injury of left ankle, initial encounter: Secondary | ICD-10-CM

## 2013-01-22 MED ORDER — MORPHINE SULFATE 4 MG/ML IJ SOLN
6.0000 mg | Freq: Once | INTRAMUSCULAR | Status: AC
  Administered 2013-01-22: 6 mg via INTRAVENOUS
  Filled 2013-01-22: qty 2

## 2013-01-22 MED ORDER — IBUPROFEN 800 MG PO TABS: 800.0000 mg | ORAL_TABLET | Freq: Three times a day (TID) | ORAL | Status: DC | PRN

## 2013-01-22 MED ORDER — OXYCODONE-ACETAMINOPHEN 5-325 MG PO TABS: 1.0000 | ORAL_TABLET | Freq: Four times a day (QID) | ORAL | Status: DC | PRN

## 2013-01-22 MED ORDER — HYDROMORPHONE HCL PF 1 MG/ML IJ SOLN
1.0000 mg | Freq: Once | INTRAMUSCULAR | Status: AC
  Administered 2013-01-22: 1 mg via INTRAVENOUS
  Filled 2013-01-22: qty 1

## 2013-01-22 MED ORDER — KETOROLAC TROMETHAMINE 30 MG/ML IJ SOLN
30.0000 mg | Freq: Once | INTRAMUSCULAR | Status: AC
  Administered 2013-01-22: 30 mg via INTRAVENOUS
  Filled 2013-01-22: qty 1

## 2013-01-22 NOTE — ED Notes (Signed)
Per EMS: Pt was walking on the sidewalk last night, rolled his ankle, and has been c/o pain since.  Lt ankle is swollen.  Was given 10 mg altogether of morphine.

## 2013-01-22 NOTE — ED Provider Notes (Signed)
Medical screening examination/treatment/procedure(s) were performed by non-physician practitioner and as supervising physician I was immediately available for consultation/collaboration.  EKG Interpretation   None        Shon Baton, MD 01/22/13 [phone]

## 2013-01-22 NOTE — ED Provider Notes (Signed)
CSN: [id number]     Arrival date & time 01/22/13  1004 History   First MD Initiated Contact with Patient 01/22/13 1011     Chief Complaint  Patient presents with  . Ankle Pain   (Consider location/radiation/quality/duration/timing/severity/associated sxs/prior Treatment) HPI  Patient presents to the emergency department with left ankle injury that occurred last night.  Patient, states, that he was walking on an uneven sidewalk when he rolled his ankle.  Patient, states, that he's having pain in the left ankle with swelling.  Patient, states he isn't having any numbness, or weakness in the foot or leg.  Patient, states he did not injure himself anywhere else.  Patient, states, that movement and palpation make his pain, worse.  Past Medical History  Diagnosis Date  . Back pain   . 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 Shingles    Past Surgical History  Procedure Laterality Date  . Facial fracture surgery  2006   No family history on file. History  Substance Use Topics  . Smoking status: Current Every Day Smoker -- 1.00 packs/day for 10 years    Types: Cigarettes  . Smokeless tobacco: Current User    Types: Snuff  . Alcohol Use: No    Review of Systems All other systems negative except as documented in the HPI. All pertinent positives and negatives as reviewed in the HPI. Allergies  Penicillins; Doxycycline; Hydrocodone; and Robaxin  Home Medications   Current Outpatient Rx  Name  Route  Sig  Dispense  Refill  . acetaminophen (TYLENOL) 500 MG tablet   Oral   Take 1,000 mg by mouth every 6 (six) hours as needed for pain.          BP 134/56  Pulse 84  Temp(Src) 98.4 F (36.9 C) (Oral)  Resp 16  SpO2 94% Physical Exam  Nursing note and vitals reviewed. Constitutional: He is oriented to person, place, and time. He appears well-developed and well-nourished. No distress.  HENT:  Head: Normocephalic and atraumatic.  Eyes: Pupils are equal, round, and reactive to light.    Pulmonary/Chest: Effort normal.  Musculoskeletal:       Left ankle: He exhibits swelling. He exhibits no ecchymosis and no deformity. Tenderness. Lateral malleolus tenderness found. No head of 5th metatarsal and no proximal fibula tenderness found. Achilles tendon normal.       Feet:  Neurological: He is alert and oriented to person, place, and time.  Skin: Skin is warm and dry.    ED Course  Procedures (including critical care time) Labs Review Labs Reviewed - No data to display Imaging Review Dg Ankle Complete Left  01/22/2013   CLINICAL DATA:  Pain post trauma  EXAM: LEFT ANKLE COMPLETE - 3+ VIEW  COMPARISON:  None.  FINDINGS: Frontal, oblique, and lateral views were obtained. There is a joint effusion. There is no demonstrable fracture. Ankle mortise appears intact.  IMPRESSION: Joint effusion; question ligamentous injury. No demonstrable fracture. Mortise appears intact.   Electronically Signed   By: Bretta Bang M.D.   On: 01/22/2013 11:06    EKG Interpretation   None       Patient be referred to orthopedics for further evaluation.  The patient is advised of x-ray findings.  All questions were answered.  Patient be given pain control for home.  Told to return here as needed.  Ice and elevate the foot and ankle   HARTLEY URTON, PA-C 01/22/13 1152

## 2013-06-17 ENCOUNTER — Emergency Department (HOSPITAL_COMMUNITY): Payer: Medicaid Other

## 2013-06-17 ENCOUNTER — Emergency Department (HOSPITAL_COMMUNITY)
Admission: EM | Admit: 2013-06-17 | Discharge: 2013-06-17 | Disposition: A | Discharge status: Home / Self Care | Payer: Medicaid Other | Attending: Emergency Medicine | Admitting: Emergency Medicine

## 2013-06-17 ENCOUNTER — Encounter (HOSPITAL_COMMUNITY): Payer: Self-pay | Admitting: Emergency Medicine

## 2013-06-17 DIAGNOSIS — M545 Low back pain, unspecified: Secondary | ICD-10-CM

## 2013-06-17 DIAGNOSIS — IMO0002 Reserved for concepts with insufficient information to code with codable children: Secondary | ICD-10-CM | POA: Insufficient documentation

## 2013-06-17 DIAGNOSIS — G8929 Other chronic pain: Secondary | ICD-10-CM | POA: Insufficient documentation

## 2013-06-17 DIAGNOSIS — Z8619 Personal history of other infectious and parasitic diseases: Secondary | ICD-10-CM | POA: Insufficient documentation

## 2013-06-17 DIAGNOSIS — Z79899 Other long term (current) drug therapy: Secondary | ICD-10-CM | POA: Insufficient documentation

## 2013-06-17 DIAGNOSIS — W11XXXA Fall on and from ladder, initial encounter: Secondary | ICD-10-CM | POA: Insufficient documentation

## 2013-06-17 DIAGNOSIS — Y929 Unspecified place or not applicable: Secondary | ICD-10-CM | POA: Insufficient documentation

## 2013-06-17 DIAGNOSIS — F17200 Nicotine dependence, unspecified, uncomplicated: Secondary | ICD-10-CM | POA: Insufficient documentation

## 2013-06-17 DIAGNOSIS — Y9389 Activity, other specified: Secondary | ICD-10-CM | POA: Insufficient documentation

## 2013-06-17 DIAGNOSIS — Z880 Allergy status to penicillin: Secondary | ICD-10-CM | POA: Insufficient documentation

## 2013-06-17 HISTORY — DX: Other chronic pain: G89.29

## 2013-06-17 HISTORY — DX: Sciatica, right side: M54.31

## 2013-06-17 HISTORY — DX: Dorsalgia, unspecified: M54.9

## 2013-06-17 MED ORDER — OXYCODONE-ACETAMINOPHEN 5-325 MG PO TABS: ORAL_TABLET | ORAL | Status: DC

## 2013-06-17 MED ORDER — OXYCODONE-ACETAMINOPHEN 5-325 MG PO TABS
2.0000 | ORAL_TABLET | Freq: Once | ORAL | Status: AC
  Administered 2013-06-17: 2 via ORAL
  Filled 2013-06-17: qty 2

## 2013-06-17 MED ORDER — NAPROXEN 250 MG PO TABS: 250.0000 mg | ORAL_TABLET | Freq: Two times a day (BID) | ORAL | Status: DC

## 2013-06-17 MED ORDER — CYCLOBENZAPRINE HCL 10 MG PO TABS: 10.0000 mg | ORAL_TABLET | Freq: Three times a day (TID) | ORAL | Status: DC | PRN

## 2013-06-17 NOTE — Discharge Instructions (Signed)
°Emergency Department Resource Guide °1) Find a Doctor and Pay Out of Pocket °Although you won't have to find out who is covered by your insurance plan, it is a good idea to ask around and get recommendations. You will then need to call the office and see if the doctor you have chosen will accept you as a new patient and what types of options they offer for patients who are self-pay. Some doctors offer discounts or will set up payment plans for their patients who do not have insurance, but you will need to ask so you aren't surprised when you get to your appointment. ° °2) Contact Your Local Health Department °Not all health departments have doctors that can see patients for sick visits, but many do, so it is worth a call to see if yours does. If you don't know where your local health department is, you can check in your phone book. The CDC also has a tool to help you locate your state's health department, and many state websites also have listings of all of their local health departments. ° °3) Find a Walk-in Clinic °If your illness is not likely to be very severe or complicated, you may want to try a walk in clinic. These are popping up all over the country in pharmacies, drugstores, and shopping centers. They're usually staffed by nurse practitioners or physician assistants that have been trained to treat common illnesses and complaints. They're usually fairly quick and inexpensive. However, if you have serious medical issues or chronic medical problems, these are probably not your best option. ° °No Primary Care Doctor: °- Call [facility] at  832-8000 - they can help you locate a primary care doctor that  accepts your insurance, provides certain services, etc. °- Physician Referral Service- 1-800-533-3463 ° °Chronic Pain Problems: °Organization         Address  Phone   Notes  °[facility]  (336) 297-2271 Patients need to be referred by their primary care doctor.  ° °Medication  Assistance: °Organization         Address  Phone   Notes  °[place] [facility] [address] °[place], [place] [address] (336) 641-8030 --Must be a resident of [place] °-- Must have NO insurance coverage whatsoever (no Medicaid/ Medicare, etc.) °-- The pt. MUST have a primary care doctor that directs their care regularly and follows them in the community °  °[facility]  (866) 331-1348   °[employer]  (888) 892-1162   ° °Agencies that provide inexpensive medical care: °Organization         Address  Phone   Notes  °[facility]  (336) 832-8035   °[facility]    (336) 832-7272   °[facility] [address] °[place], [place] [address] (336) 832-4777   °[facility] [address], °[place] (336) 271-4999   °[facility]    (336) 373-0678   °[facility]    (336) 272-1050   °[facility] ° [address], [place] Phone:  (336) 832-4444, Fax:  (336) 832-4440 Hours of Operation:  9 am - 6 pm, M-F.  Also accepts Medicaid/Medicare and self-pay.  ° [facility] ° [address], [place] Phone: (336) 832-3150, Fax: (336) 832-3151. Hours of Operation:  8:30 am - 5:30 pm, M-F.  Also accepts Medicaid and self-pay.  °[facility] [place] [address], [place] Phone: (336) 878-6027   °[facility] [address], [place], [place] (336)723-1848, Ext. 123 Mondays & Thursdays: 7-9 AM.  First 15 patients are seen on a first come, first serve basis. °  ° °Medicaid-accepting [place] Providers: ° °Organization         Address  Phone   Notes  °[facility] [address], [place] (336) 641-2100 Also accepts self-pay patients.  °[facility] [address], [place] ° (336) 856-9996   °[facility] [address], [place]  (336) 288-8857   °[facility] [address] [place] [address], [place] (336) 299-7000   °Veita Bland [address], [place]  ° (336) 373-1557 Only accepts [place] Access Medicaid patients after they have their name applied to their card.  ° °Self-Pay (no insurance) in [place]: ° °Organization         Address  Phone   Notes  °Sickle Cell Patients, [facility] [address], [place] (336) 832-1970   °[facility] [address], [place] (336) 832-4400   °[facility] ° [address], [place] (336) 992-4800   °[facility]/Dr. Osei-Bonsu ° [address] [place] [address], [place] or [address], [place] (336) 841-8500 Phone number for both [place] and [place] locations is the same.  °[facility] [address], [place] (336) 299-0000   °[facility] [address] [place] [address], [place] or [address] (336) 852-7530 °(336) 878-2260   °[facility] [address], [place] (336) 350-1642, phone; (336) 294-5005, fax Sees patients 1st and 3rd Saturday of every month.  Must not qualify for public or private insurance (i.e. Medicaid, Medicare, [place] Health Choice, Veterans' Benefits) • Household income should be no more than 200% of the poverty level •The clinic cannot treat you if you are pregnant or think you are pregnant • Sexually transmitted diseases are not treated at the clinic.  ° ° °Dental Care: °Organization         Address  Phone  Notes  °[place] [facility] [address],  (336) 641-6152 Accepts children up to age 21 who are enrolled in Medicaid or [place] Health Choice; pregnant women with a Medicaid card; and children who have applied for Medicaid or [place] Health Choice, but were declined, whose parents can pay a reduced fee at time of service.  °[place]  [facility] [place]  [address], [place] (336) 641-7733 Accepts children up to age 21 who are enrolled in Medicaid or [place] Health Choice; pregnant women with a Medicaid card; and children who have applied for Medicaid or [place] Health Choice, but were declined, whose parents can pay a reduced fee at time of service.  °[facility] ° [address],  (336) 641-4533 Patients are seen by appointment only. Walk-ins are not accepted. [facility] will see patients 18 years of age and older. °Monday - Tuesday (8am-5pm) °Most Wednesdays (8:30-5pm) °$30 per visit, cash only  °[facility] ° [address], [place] (336) 641-4533 Patients are seen by appointment only. Walk-ins are not accepted. [facility] will see patients 18 years of age and older. °One   Wednesday Evening (Monthly: Volunteer Based).  $30 per visit, cash only  °[facility]  (919) 537-3737 for adults; Children under age 4, call Graduate Pediatric Dentistry at (919) [phone]. Children aged 4-14, please call (919) [phone] to request a pediatric application. ° Dental services are provided in all areas of dental care including fillings, crowns and bridges, complete and partial dentures, implants, gum treatment, root canals, and extractions. Preventive care is also provided. Treatment is provided to both adults and children. °Patients are selected via a lottery and there is often a waiting list. °  °[facility] [address], °[place] ° (336) 763-8833 [email or web address] °  °[facility] [address], [place], [place] (336)723-1848, Ext. 123 Second and Fourth Thursday of each month, opens at 6:30 AM; Clinic ends at 9 AM.  Patients are seen on a first-come first-served basis, and a limited number are seen during each clinic.  ° °[facility] ° [address], [place], [place] (336) 723-7904    Eligibility Requirements °You must have lived in [place], [place], or [place] counties for at least the last three months. °  You cannot be eligible for state or federal sponsored healthcare insurance, including Veterans Administration, Medicaid, or Medicare. °  You generally cannot be eligible for healthcare insurance through your employer.  °  How to apply: °Eligibility screenings are held every Tuesday and Wednesday afternoon from 1:00 pm until 4:00 pm. You do not need an appointment for the interview!  °[facility] [address], [place], [place] 336-631-2330   °[facility]  336-342-8273   °[facility]  336-703-3100   °[facility]  336-570-6415   ° °Behavioral Health Resources in the Community: °Intensive Outpatient Programs °Organization         Address  Phone  Notes  °[facility] [address], [place], [place] 336-878-6098   °[facility] [address], [place], [place] 336-832-9800   °[facility] [address], [place], [place] ° 336-882-2125   °[facility] [address],  °[place], [place] 1-800-853-5163 or 336-641-4981   °Substance Abuse Resources °Organization         Address  Phone  Notes  °[facility]  336-882-2125   °[facility]  336-784-9470   °[facility]  336-285-9073   °[facility]  336-845-3988   °Residential & Outpatient Substance Abuse Program  1-800-659-3381   °Psychological Services °Organization         Address  Phone  Notes  °[facility]  336- 832-9600   °[facility]  336- 378-7881   °[facility] [address], [place] 1-800-853-5163 or 336-641-4981   ° °Mobile Crisis Teams °Organization         Address  Phone  Notes  °[facility], Mobile Crisis Care Unit  1-877-626-1772   °Assertive °[facility] ° [address]  [place], [place] 336-834-9664   °Sharon DeEsch [address] °[place] 336-554-5454   ° °Self-Help/Support Groups °Organization         Address  Phone             Notes  °[facility] [place] - variety of support groups  336- 373-1402 Call for more information  °Narcotics Anonymous (NA), [facility] [address], °[place]  2 meetings at this location  ° °  Residential Treatment Programs °Organization         Address  Phone  Notes  °[facility] [address],    °[place]  1-866-801-8205   °[facility] ° [address], [place], [place] 704-293-8524   °[facility] [address], [place] 336-845-3988 Admissions: 8am-3pm M-F  °[facility] [address]    °[place], [place] 336-841-1104   °[facility] [address], [place], [place] 336-379-7146   °[facility] [address]  °[place], [place] 336-285-9073   °[facility] - Intensive Outpatient [address], [place], [place] 336-852-3033   °[facility] [address]  °[place], [place] 1-877-615-2722 or 336-784-9470   °[facility] [address] [place], [place] 336-227-7417 Accepts Medicaid  °[facility] [address]  °[place] 1-800-659-3381 Substance Abuse/Addiction Treatment  ° °[place] Behavioral Health Resources °Organization         Address  Phone  Notes  °[facility]  (888) 581-9988   °Julie Brannon, PhD [address] [place], [place]   (336) 349-5553 or (336) 951-0000   °[facility]   [address] °[name], [place] (336) 349-4454   °[facility] [address], [place], [place] (336) 342-8316 Insurance/Medicaid/sponsorship through [facility]  °[facility] [address]                                    [place], [place] (336) 342-8316 Therapy/tele-psych/case    °[facility] [address]  ° [place], [place] (336) 349-2233    °Dr. Arfeen  (336) 349-4544   °[facility] [place]  [employer] [place] [facility] 1) [address], [place] °2) [address], [place] °3)  [address], [place] (336) 349-3220 °(336) 342-7768 ° °(336) 342-8140   °[place] [facility] (336) 342-1394 or (336) 342-3537 (After Hours)    ° ° ° °Take the prescriptions as directed.  Apply moist heat or ice to the area(s) of discomfort, for 15 minutes at a time, several times per day for the next few days.  Do not fall asleep on a heating or ice pack.  Call your regular medical doctor on Monday to schedule a follow up appointment in the next 3 days.  Return to the Emergency Department immediately if worsening. ° °

## 2013-06-17 NOTE — ED Notes (Signed)
Patient was on 5 foot ladder when 2x6 fell on it causing it to fall.  Fell straight onto back on ground.  Pain in lower back radiating into legs bilaterally but R>L. Burning and tingling in R side.  Denies loss of bowel or bladder control.

## 2013-06-17 NOTE — ED Provider Notes (Signed)
CSN: [id number]     Arrival date & time 06/17/13  1555 History   First MD Initiated Contact with Patient 06/17/13 1753     Chief Complaint  Patient presents with  . Fall      HPI Pt was seen at 1800. Per pt, c/o gradual onset and persistence of constant acute flair of his chronic low back "pain" since approximately 1500 today PTA.  Pt states he was standing on the 4th or 5th rung of a ladder when a piece of lumbar "hit the ladder and made me fall." Pt states he fell backwards, landing onto his back. Describes the pain as "aching," with radiation into his RLE, and per his usual chronic pain pattern.  Pain worsens with palpation of the area and body position changes. Denies hitting head, no LOC, no AMS, no CP/SOB, no abd pain, no incont/retention of bowel or bladder, no saddle anesthesia, no focal motor weakness, no tingling/numbness in extremities.   The symptoms have been associated with no other complaints. The patient has a significant history of similar symptoms previously, recently being evaluated for this complaint and multiple prior evals for same.     Past Medical History  Diagnosis Date  . 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 Shingles   . Chronic back pain   . Sciatica of right side    Past Surgical History  Procedure Laterality Date  . Facial fracture surgery  2006    History  Substance Use Topics  . Smoking status: Current Every Day Smoker -- 1.00 packs/day for 10 years    Types: Cigarettes  . Smokeless tobacco: Current User    Types: Snuff  . Alcohol Use: No    Review of Systems ROS: Statement: All systems negative except as marked or noted in the HPI; Constitutional: Negative for fever and chills. ; ; Eyes: Negative for eye pain, redness and discharge. ; ; ENMT: Negative for ear pain, hoarseness, nasal congestion, sinus pressure and sore throat. ; ; Cardiovascular: Negative for chest pain, palpitations, diaphoresis, dyspnea and peripheral edema. ; ; Respiratory: Negative for  cough, wheezing and stridor. ; ; Gastrointestinal: Negative for nausea, vomiting, diarrhea, abdominal pain, blood in stool, hematemesis, jaundice and rectal bleeding. . ; ; Genitourinary: Negative for dysuria, flank pain and hematuria. ; ; Musculoskeletal: +LBP. Negative for neck pain. Negative for swelling and trauma.; ; Skin: Negative for pruritus, rash, abrasions, blisters, bruising and skin lesion.; ; Neuro: Negative for headache, lightheadedness and neck stiffness. Negative for weakness, altered level of consciousness , altered mental status, extremity weakness, paresthesias, involuntary movement, seizure and syncope.      Allergies  Penicillins; Doxycycline; Hydrocodone; and Robaxin  Home Medications   Prior to Admission medications   Medication Sig Start Date End Date Taking? Authorizing Provider  acetaminophen (TYLENOL) 500 MG tablet Take 1,000 mg by mouth every 6 (six) hours as needed for pain.    Historical Provider, MD  oxyCODONE-acetaminophen (PERCOCET/ROXICET) 5-325 MG per tablet Take 1 tablet by mouth every 6 (six) hours as needed for severe pain. 01/22/13   Resa Miner Lawyer, PA-C   BP 135/93  Pulse 91  Temp(Src) 97.9 F (36.6 C) (Oral)  Resp 16  Ht 6' (1.829 m)  Wt 200 lb (90.719 kg)  BMI 27.12 kg/m2  SpO2 99% Physical Exam 1805: Physical examination: Vital signs and O2 SAT: Reviewed; Constitutional: Well developed, Well nourished, Well hydrated, In no acute distress; Head and Face: Normocephalic, Atraumatic; Eyes: EOMI, PERRL, No scleral icterus; ENMT: Mouth and pharynx  normal, Left TM normal, Right TM normal, Mucous membranes moist; Neck: Supple, Trachea midline; Spine: +TTP right lumbar paraspinal muscles. No abrasions or ecchymosis. No midline CS, TS, LS tenderness.; Cardiovascular: Regular rate and rhythm, No murmur, rub, or gallop; Respiratory: Breath sounds clear & equal bilaterally, No rales, rhonchi, wheezes, Normal respiratory effort/excursion; Chest: Nontender,  No deformity, Movement normal, No crepitus, No abrasions or ecchymosis.; Abdomen: Soft, Nontender, Nondistended, Normal bowel sounds, No abrasions or ecchymosis.; Genitourinary: No CVA tenderness;; Extremities: No deformity, Full range of motion major/large joints of bilat UE's and LE's without pain or tenderness to palp, Neurovascularly intact, Pulses normal, No tenderness, No edema, Pelvis stable; Neuro: AA&Ox3, GCS 15.  Major CN grossly intact. Speech clear. No gross focal motor or sensory deficits in extremities. Climbs on and off stretcher easily by himself. Gait steady. Strength 5/5 equal bilat UE's and LE's, including great toe dorsiflexion.  DTR 2/4 equal bilat UE's and LE's.  No gross sensory deficits.  Neg straight leg raises bilat.; Skin: Color normal, Warm, Dry    ED Course  Procedures     EKG Interpretation None      MDM  MDM Reviewed: previous chart, nursing note and vitals Reviewed previous: x-ray Interpretation: x-ray and CT scan    Dg Chest 2 View 06/17/2013   CLINICAL DATA:  Trauma secondary to a fall from a ladder. Back pain.  EXAM: CHEST  2 VIEW  COMPARISON:  01/22/2012  FINDINGS: Heart size and pulmonary vascularity are normal and the lungs are clear. No pneumothorax or pleural effusion. Slight thoracic scoliosis, stable.  IMPRESSION: No acute abnormalities.   Electronically Signed   By: Rozetta Nunnery M.D.   On: 06/17/2013 18:35   Dg Lumbar Spine Complete 06/17/2013   CLINICAL DATA:  Low back pain status post fall  EXAM: LUMBAR SPINE - COMPLETE 4+ VIEW  COMPARISON:  DG LUMBAR SPINE COMPLETE dated 02/25/2012  FINDINGS: There is no evidence of lumbar spine fracture. Alignment is normal. Intervertebral disc spaces are maintained. Pedicles and transverse processes intact. Observed portions of the sacrum normal.  IMPRESSION: No acute lumbar spine fracture nor dislocation.   Electronically Signed   By: David  [place]   On: 06/17/2013 16:52   Ct Lumbar Spine Wo Contrast 06/17/2013    CLINICAL DATA:  [place] off ladder.  Severe low back pain.  EXAM: CT LUMBAR SPINE WITHOUT CONTRAST  TECHNIQUE: Multidetector CT imaging of the lumbar spine was performed without intravenous contrast administration. Multiplanar CT image reconstructions were also generated.  COMPARISON:  Current lumbar spine radiographs.  FINDINGS: No fracture.  No spondylolisthesis.  No disc bulging. No disk herniations. Central spinal canal, lateral recesses and neural foramina appear well preserved.  Surrounding soft tissues are unremarkable.  IMPRESSION: Normal lumbar spine CT.   Electronically Signed   By: Lajean Manes M.D.   On: 06/17/2013 18:31    1900:  XR/CT reassuring. Neuro exam remains intact. Will tx symptomatically at this time. Dx and testing d/w pt.  Questions answered.  Verb understanding, agreeable to d/c home with outpt f/u.   Alfonzo Feller, DO 06/20/13 1129

## 2013-06-17 NOTE — ED Notes (Signed)
Patient with no complaints at this time. Respirations even and unlabored. Skin warm/dry. Discharge instructions reviewed with patient at this time. Patient given opportunity to voice concerns/ask questions. Patient discharged at this time and left Emergency Department with steady gait.   

## 2013-06-18 ENCOUNTER — Encounter (HOSPITAL_COMMUNITY): Payer: Self-pay | Admitting: Emergency Medicine

## 2013-06-18 ENCOUNTER — Emergency Department (HOSPITAL_COMMUNITY)
Admission: EM | Admit: 2013-06-18 | Discharge: 2013-06-18 | Disposition: A | Discharge status: Home / Self Care | Payer: Medicaid Other | Attending: Emergency Medicine | Admitting: Emergency Medicine

## 2013-06-18 DIAGNOSIS — Z79899 Other long term (current) drug therapy: Secondary | ICD-10-CM | POA: Insufficient documentation

## 2013-06-18 DIAGNOSIS — F17200 Nicotine dependence, unspecified, uncomplicated: Secondary | ICD-10-CM | POA: Insufficient documentation

## 2013-06-18 DIAGNOSIS — Z8619 Personal history of other infectious and parasitic diseases: Secondary | ICD-10-CM | POA: Insufficient documentation

## 2013-06-18 DIAGNOSIS — M545 Low back pain, unspecified: Secondary | ICD-10-CM | POA: Insufficient documentation

## 2013-06-18 DIAGNOSIS — Z880 Allergy status to penicillin: Secondary | ICD-10-CM | POA: Insufficient documentation

## 2013-06-18 DIAGNOSIS — G8929 Other chronic pain: Secondary | ICD-10-CM | POA: Insufficient documentation

## 2013-06-18 NOTE — ED Notes (Signed)
Patient with no complaints at this time. Respirations even and unlabored. Skin warm/dry. Discharge instructions reviewed with patient at this time. Patient given opportunity to voice concerns/ask questions. Patient discharged at this time and left Emergency Department with steady gait.   

## 2013-06-18 NOTE — Discharge Instructions (Signed)
You were instructed to proceed directly to the jail last night as documented in your chart from last night.  We cannot medically say you cannot stay in jail this weekend - this should not adversely affect your back, especially since your xrays and your CT scan completed yesterday were normal.

## 2013-06-18 NOTE — ED Notes (Signed)
Pt requested triage nurse to call jail in [place] and send paperwork to release pt from pulling weekend jail term d/t back pain and inability to receive medication and use heating pad/ice pack at facility. RN stated to patient that he was given a work note that specified no heavy lifting >10 lbs and ED is unable to write pt out of jail sentence. RN spoke with Corporal Phineas Douglas [phone], she stated pt should have reported in after leaving the hospital last night (pt would have been able to log his medication in with jail nurse at that time) and relayed message to pt to contact his attorney or report directly to the jail at this time and speak with the [facility] coordinator on Tuesday regarding his work release.

## 2013-06-18 NOTE — ED Notes (Addendum)
Pt desires an additional evaluation for the pain he is experiencing after his fall. NAD, ambulating WNL, VSS. Pt reports he is in need of a doctor's note due to jail requirements. Pt reports he still has adequate pain medicines at home.

## 2013-06-20 NOTE — ED Provider Notes (Signed)
CSN: [id number]     Arrival date & time 06/18/13  1328 History   First MD Initiated Contact with Patient 06/18/13 1458     Chief Complaint  Patient presents with  . Fall     (Consider location/radiation/quality/duration/timing/severity/associated sxs/prior Treatment) HPI Comments: Alex Mullen is a 28 y.o. Male presenting for assistance with a note he needs to be excused from jail for the remainder of the weekend.  He was seen here yesterday after falling from a ladder,  Injuring his lower back.  His workup including a Ct scan of his lumbar spine was negative for acute injury.  He was supposed to present to [facility] jail as he is currently serving a 20 week weekend sentence but he did not present after leaving here as he was concerned that sleeping on a jail cot would worsen his pain.  When he left here,  It was determined by our nursing staff that he could bring his pain medicines with him as there was a nurse available yesterday that could "check these in".  This nurse is not there now so if he presents he will not be able to take his medicines with him.  If he misses this weekend his sentence will be changed to consecutive days rather than weekends only.  He is requesting a note from [place] stating he cannot sleep on their cots.  He denies any new or worsened pain.   There has been no weakness or numbness in the lower extremities and no urinary or bowel retention or incontinence.     The history is provided by the patient.    Past Medical History  Diagnosis Date  . 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 Shingles   . Chronic back pain   . Sciatica of right side    Past Surgical History  Procedure Laterality Date  . Facial fracture surgery  2006   History reviewed. No pertinent family history. History  Substance Use Topics  . Smoking status: Current Every Day Smoker -- 1.00 packs/day for 10 years    Types: Cigarettes  . Smokeless tobacco: Current User    Types: Snuff  . Alcohol Use: No     Review of Systems  Constitutional: Negative for fever.  Respiratory: Negative for shortness of breath.   Cardiovascular: Negative for chest pain and leg swelling.  Gastrointestinal: Negative for abdominal pain, constipation and abdominal distention.  Genitourinary: Negative for dysuria, urgency, frequency, flank pain and difficulty urinating.  Musculoskeletal: Positive for back pain. Negative for gait problem and joint swelling.  Skin: Negative for rash.  Neurological: Negative for weakness and numbness.      Allergies  Penicillins; Doxycycline; Hydrocodone; and Robaxin  Home Medications   Prior to Admission medications   Medication Sig Start Date End Date Taking? Authorizing Provider  cyclobenzaprine (FLEXERIL) 10 MG tablet Take 1 tablet (10 mg total) by mouth 3 (three) times daily as needed for muscle spasms. 06/17/13  Yes Alfonzo Feller, DO  naproxen (NAPROSYN) 250 MG tablet Take 1 tablet (250 mg total) by mouth 2 (two) times daily with a meal. 06/17/13  Yes Alfonzo Feller, DO  oxyCODONE-acetaminophen (PERCOCET/ROXICET) 5-325 MG per tablet Take 1 tablet by mouth every 6 (six) hours as needed for severe pain. 01/22/13  Yes Resa Miner Lawyer, PA-C   BP 153/91  Pulse 106  Temp(Src) 98.3 F (36.8 C) (Oral)  Resp 18  Ht 6' (1.829 m)  Wt 200 lb (90.719 kg)  BMI 27.12 kg/m2  SpO2  99% Physical Exam  Nursing note and vitals reviewed. Constitutional: He appears well-developed and well-nourished.  HENT:  Head: Normocephalic.  Eyes: Conjunctivae are normal.  Neck: Normal range of motion. Neck supple.  Cardiovascular: Normal rate and intact distal pulses.   Pedal pulses normal.  Pulmonary/Chest: Effort normal.  Abdominal: Soft. Bowel sounds are normal. He exhibits no distension and no mass.  Musculoskeletal: Normal range of motion. He exhibits tenderness. He exhibits no edema.       Lumbar back: He exhibits tenderness. He exhibits no swelling, no edema and no spasm.   Neurological: He is alert. He has normal strength. He displays no atrophy and no tremor. No sensory deficit. Gait normal.  No strength deficit noted in hip and knee flexor and extensor muscle groups.  Ankle flexion and extension intact.  Pt is ambulatory without difficulty  Skin: Skin is warm and dry.  Psychiatric: He has a normal mood and affect.    ED Course  Procedures (including critical care time) Labs Review Labs Reviewed - No data to display  Imaging Review No results found.   EKG Interpretation None      MDM   Final diagnoses:  Low back pain    Advised patient there is no reason he cannot fulfill his jail obligation from a medical standpoint.  Pt became angry and argumentative, stating we are ruining his life, but left voluntarily.  Advised he should f/u with his jailor and/or attorney for further advice regarding his jail time,  We can only advise re medical recommendations.    Evalee Jefferson, PA-C 06/20/13 1342

## 2013-06-20 NOTE — ED Provider Notes (Signed)
Medical screening examination/treatment/procedure(s) were performed by non-physician practitioner and as supervising physician I was immediately available for consultation/collaboration.   EKG Interpretation None       Orlie Dakin, MD 06/20/13 1715

## 2013-07-10 ENCOUNTER — Encounter (HOSPITAL_COMMUNITY): Payer: Self-pay | Admitting: Emergency Medicine

## 2013-07-10 ENCOUNTER — Emergency Department (HOSPITAL_COMMUNITY): Payer: Medicaid Other

## 2013-07-10 ENCOUNTER — Emergency Department (HOSPITAL_COMMUNITY)
Admission: EM | Admit: 2013-07-10 | Discharge: 2013-07-10 | Disposition: A | Discharge status: Home / Self Care | Payer: Medicaid Other | Attending: Emergency Medicine | Admitting: Emergency Medicine

## 2013-07-10 DIAGNOSIS — Z880 Allergy status to penicillin: Secondary | ICD-10-CM | POA: Insufficient documentation

## 2013-07-10 DIAGNOSIS — Z79899 Other long term (current) drug therapy: Secondary | ICD-10-CM | POA: Insufficient documentation

## 2013-07-10 DIAGNOSIS — Y929 Unspecified place or not applicable: Secondary | ICD-10-CM | POA: Insufficient documentation

## 2013-07-10 DIAGNOSIS — Z885 Allergy status to narcotic agent status: Secondary | ICD-10-CM | POA: Insufficient documentation

## 2013-07-10 DIAGNOSIS — Y939 Activity, unspecified: Secondary | ICD-10-CM | POA: Insufficient documentation

## 2013-07-10 DIAGNOSIS — S8990XA Unspecified injury of unspecified lower leg, initial encounter: Secondary | ICD-10-CM | POA: Insufficient documentation

## 2013-07-10 DIAGNOSIS — S99919A Unspecified injury of unspecified ankle, initial encounter: Secondary | ICD-10-CM

## 2013-07-10 DIAGNOSIS — S93409A Sprain of unspecified ligament of unspecified ankle, initial encounter: Secondary | ICD-10-CM | POA: Insufficient documentation

## 2013-07-10 DIAGNOSIS — Z888 Allergy status to other drugs, medicaments and biological substances status: Secondary | ICD-10-CM | POA: Insufficient documentation

## 2013-07-10 DIAGNOSIS — M545 Low back pain, unspecified: Secondary | ICD-10-CM

## 2013-07-10 DIAGNOSIS — W172XXA Fall into hole, initial encounter: Secondary | ICD-10-CM | POA: Insufficient documentation

## 2013-07-10 DIAGNOSIS — Z881 Allergy status to other antibiotic agents status: Secondary | ICD-10-CM | POA: Insufficient documentation

## 2013-07-10 DIAGNOSIS — S99929A Unspecified injury of unspecified foot, initial encounter: Secondary | ICD-10-CM

## 2013-07-10 DIAGNOSIS — F17200 Nicotine dependence, unspecified, uncomplicated: Secondary | ICD-10-CM | POA: Insufficient documentation

## 2013-07-10 DIAGNOSIS — M5430 Sciatica, unspecified side: Secondary | ICD-10-CM | POA: Insufficient documentation

## 2013-07-10 DIAGNOSIS — B029 Zoster without complications: Secondary | ICD-10-CM | POA: Insufficient documentation

## 2013-07-10 DIAGNOSIS — M549 Dorsalgia, unspecified: Secondary | ICD-10-CM | POA: Insufficient documentation

## 2013-07-10 DIAGNOSIS — G8929 Other chronic pain: Secondary | ICD-10-CM | POA: Insufficient documentation

## 2013-07-10 DIAGNOSIS — B009 Herpesviral infection, unspecified: Secondary | ICD-10-CM | POA: Insufficient documentation

## 2013-07-10 MED ORDER — OXYCODONE-ACETAMINOPHEN 5-325 MG PO TABS
2.0000 | ORAL_TABLET | Freq: Once | ORAL | Status: AC
  Administered 2013-07-10: 2 via ORAL
  Filled 2013-07-10: qty 2

## 2013-07-10 MED ORDER — OXYCODONE-ACETAMINOPHEN 5-325 MG PO TABS: 1.0000 | ORAL_TABLET | Freq: Four times a day (QID) | ORAL | Status: DC | PRN

## 2013-07-10 MED ORDER — IBUPROFEN 400 MG PO TABS
600.0000 mg | ORAL_TABLET | Freq: Once | ORAL | Status: AC
  Administered 2013-07-10: 600 mg via ORAL
  Filled 2013-07-10: qty 2

## 2013-07-10 MED ORDER — NAPROXEN 250 MG PO TABS: 250.0000 mg | ORAL_TABLET | Freq: Two times a day (BID) | ORAL | Status: DC | PRN

## 2013-07-10 NOTE — ED Provider Notes (Signed)
CSN: [id number]     Arrival date & time 07/10/13  1129 History  This chart was scribed for Virgel Manifold, MD by Marlowe Kays, ED Scribe. This patient was seen in room APFT23/APFT23 and the patient's care was started at 11:54 AM.  Chief Complaint  Patient presents with  . Back Pain   HPI HPI Comments:  Alex Mullen is a 28 y.o. male who presents to the Emergency Department complaining of severe lower back pain that radiates down his right leg onset two days ago. He also reports moderate right ankle pain. Pt states he stepped in a hole twisting his right ankle and fell to his knees causing his back to start to tighten up. He reports it has been gradually worsening. Movement makes the pain worse. He reports taking Oxycodone, Flexeril, and Naproxen with moderate relief. He states he had back issues in the past and saw Dr. Luna Glasgow. He denies bowel or bladder incontinence or difficulty urinating.   Past Medical History  Diagnosis Date  . 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 Shingles   . Chronic back pain   . Sciatica of right side    Past Surgical History  Procedure Laterality Date  . Facial fracture surgery  2006   History reviewed. No pertinent family history. History  Substance Use Topics  . Smoking status: Current Every Day Smoker -- 1.00 packs/day for 10 years    Types: Cigarettes  . Smokeless tobacco: Current User    Types: Snuff  . Alcohol Use: No    Review of Systems  Genitourinary: Negative for frequency and difficulty urinating.  Musculoskeletal: Positive for arthralgias and back pain.  All other systems reviewed and are negative.   Allergies  Penicillins; Doxycycline; Hydrocodone; and Robaxin  Home Medications   Prior to Admission medications   Medication Sig Start Date End Date Taking? Authorizing Provider  cyclobenzaprine (FLEXERIL) 10 MG tablet Take 1 tablet (10 mg total) by mouth 3 (three) times daily as needed for muscle spasms. 06/17/13   Alfonzo Feller, DO   naproxen (NAPROSYN) 250 MG tablet Take 1 tablet (250 mg total) by mouth 2 (two) times daily as needed for mild pain or moderate pain. 07/10/13   Virgel Manifold, MD  oxyCODONE-acetaminophen (PERCOCET/ROXICET) 5-325 MG per tablet Take 1 tablet by mouth every 6 (six) hours as needed for severe pain. 07/10/13   Virgel Manifold, MD   Triage Vitals: BP 132/74  Pulse 87  Temp(Src) 97.8 F (36.6 C)  Resp 18  Ht 6' (1.829 m)  Wt 200 lb (90.719 kg)  BMI 27.12 kg/m2  SpO2 98%  Physical Exam  Nursing note and vitals reviewed. Constitutional: He is oriented to person, place, and time. He appears well-developed and well-nourished.  HENT:  Head: Normocephalic and atraumatic.  Eyes: EOM are normal.  Neck: Normal range of motion.  Cardiovascular: Normal rate.   Pulmonary/Chest: Effort normal.  Musculoskeletal: Normal range of motion.  Tenderness paraspinally to right lumbar region to his right buttock. Positive SLR on right. Tenderness to medial malleolus.  Neurological: He is alert and oriented to person, place, and time.  Neurovascularly intact distally.  Skin: Skin is warm and dry.  No concerning skin changes.  Psychiatric: He has a normal mood and affect. His behavior is normal.    ED Course  Procedures (including critical care time) DIAGNOSTIC STUDIES: Oxygen Saturation is 98% on RA, normal by my interpretation.   COORDINATION OF CARE: 11:59 AM- Will order X-Ray and pain medication. Pt verbalizes understanding and  agrees to plan.  Medications  oxyCODONE-acetaminophen (PERCOCET/ROXICET) 5-325 MG per tablet 2 tablet (2 tablets Oral Given 07/10/13 1213)  ibuprofen (ADVIL,MOTRIN) tablet 600 mg (600 mg Oral Given 07/10/13 1212)    Labs Review Labs Reviewed - No data to display  Imaging Review Dg Ankle Complete Left  07/10/2013   CLINICAL DATA:  Pain.  EXAM: LEFT ANKLE COMPLETE - 3+ VIEW  COMPARISON:  Ankle series 12 08/15/2012.  FINDINGS: Tiny bony density noted adjacent to the tip of the  lateral malleolus. Tiny avulsion fracture cannot be entirely excluded. This may be old. No significant soft tissue swelling is noted in this region. No other abnormality identified.  IMPRESSION: Punctate bony density noted adjacent to the tip of the lateral malleus. Tiny avulsion fracture cannot be entirely excluded. This may be old. No significant soft tissue swelling in this region noted. No acute bony abnormality otherwise noted .   Electronically Signed   By: Marcello Moores  Register   On: 07/10/2013 12:37     EKG Interpretation None      MDM   Final diagnoses:  Lower back pain  Ankle sprain     I personally performed the services described in this documentation, which was scribed in my presence. The recorded information has been reviewed and is accurate.    Virgel Manifold, MD 07/15/13 [phone]

## 2013-07-10 NOTE — ED Notes (Signed)
MD at bedside. 

## 2013-07-10 NOTE — ED Notes (Signed)
Patient c/o lower back pain that radiates into right leg. Per patient was playing with kids in yard on Friday when he stepped in a whole twisting left ankle.Patient states "since then my back has been hurting."

## 2013-07-10 NOTE — Discharge Instructions (Signed)
Ankle Sprain °An ankle sprain is an injury to the strong, fibrous tissues (ligaments) that hold the bones of your ankle joint together.  °CAUSES °An ankle sprain is usually caused by a fall or by twisting your ankle. Ankle sprains most commonly occur when you step on the outer edge of your foot, and your ankle turns inward. People who participate in sports are more prone to these types of injuries.  °SYMPTOMS  °· Pain in your ankle. The pain may be present at rest or only when you are trying to stand or walk. °· Swelling. °· Bruising. Bruising may develop immediately or within 1 to 2 days after your injury. °· Difficulty standing or walking, particularly when turning corners or changing directions. °DIAGNOSIS  °Your caregiver will ask you details about your injury and perform a physical exam of your ankle to determine if you have an ankle sprain. During the physical exam, your caregiver will press on and apply pressure to specific areas of your foot and ankle. Your caregiver will try to move your ankle in certain ways. An X-ray exam may be done to be sure a bone was not broken or a ligament did not separate from one of the bones in your ankle (avulsion fracture).  °TREATMENT  °Certain types of braces can help stabilize your ankle. Your caregiver can make a recommendation for this. Your caregiver may recommend the use of medicine for pain. If your sprain is severe, your caregiver may refer you to a surgeon who helps to restore function to parts of your skeletal system (orthopedist) or a physical therapist. °HOME CARE INSTRUCTIONS  °· Apply ice to your injury for 1 2 days or as directed by your caregiver. Applying ice helps to reduce inflammation and pain. °· Put ice in a plastic bag. °· Place a towel between your skin and the bag. °· Leave the ice on for 15-20 minutes at a time, every 2 hours while you are awake. °· Only take over-the-counter or prescription medicines for pain, discomfort, or fever as directed by  your caregiver. °· Elevate your injured ankle above the level of your heart as much as possible for 2 3 days. °· If your caregiver recommends crutches, use them as instructed. Gradually put weight on the affected ankle. Continue to use crutches or a cane until you can walk without feeling pain in your ankle. °· If you have a plaster splint, wear the splint as directed by your caregiver. Do not rest it on anything harder than a pillow for the first 24 hours. Do not put weight on it. Do not get it wet. You may take it off to take a shower or bath. °· You may have been given an elastic bandage to wear around your ankle to provide support. If the elastic bandage is too tight (you have numbness or tingling in your foot or your foot becomes cold and blue), adjust the bandage to make it comfortable. °· If you have an air splint, you may blow more air into it or let air out to make it more comfortable. You may take your splint off at night and before taking a shower or bath. Wiggle your toes in the splint several times per day to decrease swelling. °SEEK MEDICAL CARE IF:  °· You have rapidly increasing bruising or swelling. °· Your toes feel extremely cold or you lose feeling in your foot. °· Your pain is not relieved with medicine. °SEEK IMMEDIATE MEDICAL CARE IF: °· Your toes are numb   or blue.  You have severe pain that is increasing. MAKE SURE YOU:   Understand these instructions.  Will watch your condition.  Will get help right away if you are not doing well or get worse. Document Released: 01/13/2005 Document Revised: 10/08/2011 Document Reviewed: 01/25/2011 [facility] Patient Information 2014 [place], [place].  Back Pain, Adult Low back pain is very common. About 1 in 5 people have back pain.The cause of low back pain is rarely dangerous. The pain often gets better over time.About half of people with a sudden onset of back pain feel better in just 2 weeks. About 8 in 10 people feel better by 6 weeks.    CAUSES Some common causes of back pain include:  Strain of the muscles or ligaments supporting the spine.  Wear and tear (degeneration) of the spinal discs.  Arthritis.  Direct injury to the back. DIAGNOSIS Most of the time, the direct cause of low back pain is not known.However, back pain can be treated effectively even when the exact cause of the pain is unknown.Answering your caregiver's questions about your overall health and symptoms is one of the most accurate ways to make sure the cause of your pain is not dangerous. If your caregiver needs more information, he or she may order lab work or imaging tests (X-rays or MRIs).However, even if imaging tests show changes in your back, this usually does not require surgery. HOME CARE INSTRUCTIONS For many people, back pain returns.Since low back pain is rarely dangerous, it is often a condition that people can learn to [facility] their own.   Remain active. It is stressful on the back to sit or stand in one place. Do not sit, drive, or stand in one place for more than 30 minutes at a time. Take short walks on level surfaces as soon as pain allows.Try to increase the length of time you walk each day.  Do not stay in bed.Resting more than 1 or 2 days can delay your recovery.  Do not avoid exercise or work.Your body is made to move.It is not dangerous to be active, even though your back may hurt.Your back will likely heal faster if you return to being active before your pain is gone.  Pay attention to your body when you bend and lift. Many people have less discomfortwhen lifting if they bend their knees, keep the load close to their bodies,and avoid twisting. Often, the most comfortable positions are those that put less stress on your recovering back.  Find a comfortable position to sleep. Use a firm mattress and lie on your side with your knees slightly bent. If you lie on your back, put a pillow under your knees.  Only take  over-the-counter or prescription medicines as directed by your caregiver. Over-the-counter medicines to reduce pain and inflammation are often the most helpful.Your caregiver may prescribe muscle relaxant drugs.These medicines help dull your pain so you can more quickly return to your normal activities and healthy exercise.  Put ice on the injured area.  Put ice in a plastic bag.  Place a towel between your skin and the bag.  Leave the ice on for 15-20 minutes, 03-04 times a day for the first 2 to 3 days. After that, ice and heat may be alternated to reduce pain and spasms.  Ask your caregiver about trying back exercises and gentle massage. This may be of some benefit.  Avoid feeling anxious or stressed.Stress increases muscle tension and can worsen back pain.It is important to recognize  when you are anxious or stressed and learn ways to manage it.Exercise is a great option. SEEK MEDICAL CARE IF:  You have pain that is not relieved with rest or medicine.  You have pain that does not improve in 1 week.  You have new symptoms.  You are generally not feeling well. SEEK IMMEDIATE MEDICAL CARE IF:   You have pain that radiates from your back into your legs.  You develop new bowel or bladder control problems.  You have unusual weakness or numbness in your arms or legs.  You develop nausea or vomiting.  You develop abdominal pain.  You feel faint. Document Released: 01/13/2005 Document Revised: 07/15/2011 Document Reviewed: 06/03/2010 [facility] Patient Information 2014 [place], [place].

## 2013-07-29 ENCOUNTER — Emergency Department (HOSPITAL_COMMUNITY)
Admission: EM | Admit: 2013-07-29 | Discharge: 2013-07-29 | Disposition: A | Discharge status: Home / Self Care | Payer: Medicaid Other | Attending: Emergency Medicine | Admitting: Emergency Medicine

## 2013-07-29 ENCOUNTER — Encounter (HOSPITAL_COMMUNITY): Payer: Self-pay | Admitting: Emergency Medicine

## 2013-07-29 DIAGNOSIS — Z880 Allergy status to penicillin: Secondary | ICD-10-CM | POA: Insufficient documentation

## 2013-07-29 DIAGNOSIS — M533 Sacrococcygeal disorders, not elsewhere classified: Secondary | ICD-10-CM | POA: Insufficient documentation

## 2013-07-29 DIAGNOSIS — G8929 Other chronic pain: Secondary | ICD-10-CM | POA: Insufficient documentation

## 2013-07-29 DIAGNOSIS — M5430 Sciatica, unspecified side: Secondary | ICD-10-CM | POA: Insufficient documentation

## 2013-07-29 DIAGNOSIS — M545 Low back pain, unspecified: Secondary | ICD-10-CM | POA: Diagnosis present

## 2013-07-29 DIAGNOSIS — M5431 Sciatica, right side: Secondary | ICD-10-CM

## 2013-07-29 DIAGNOSIS — F17200 Nicotine dependence, unspecified, uncomplicated: Secondary | ICD-10-CM | POA: Diagnosis not present

## 2013-07-29 DIAGNOSIS — Z8619 Personal history of other infectious and parasitic diseases: Secondary | ICD-10-CM | POA: Diagnosis not present

## 2013-07-29 MED ORDER — CYCLOBENZAPRINE HCL 10 MG PO TABS
10.0000 mg | ORAL_TABLET | Freq: Once | ORAL | Status: AC
  Administered 2013-07-29: 10 mg via ORAL
  Filled 2013-07-29: qty 1

## 2013-07-29 MED ORDER — CYCLOBENZAPRINE HCL 10 MG PO TABS: 10.0000 mg | ORAL_TABLET | Freq: Three times a day (TID) | ORAL | Status: DC | PRN

## 2013-07-29 MED ORDER — PREDNISONE 10 MG PO TABS
ORAL_TABLET | ORAL | Status: DC
Start: 2013-07-29 — End: 2015-03-15

## 2013-07-29 MED ORDER — OXYCODONE-ACETAMINOPHEN 5-325 MG PO TABS
2.0000 | ORAL_TABLET | Freq: Once | ORAL | Status: AC
  Administered 2013-07-29: 2 via ORAL
  Filled 2013-07-29: qty 2

## 2013-07-29 MED ORDER — OXYCODONE-ACETAMINOPHEN 5-325 MG PO TABS: 1.0000 | ORAL_TABLET | ORAL | Status: DC | PRN

## 2013-07-29 NOTE — Discharge Instructions (Signed)
Sciatica °Sciatica is pain, weakness, numbness, or tingling along your sciatic nerve. The nerve starts in the lower back and runs down the back of each leg. Nerve damage or certain conditions pinch or put pressure on the sciatic nerve. This causes the pain, weakness, and other discomforts of sciatica. °HOME CARE  °· Only take medicine as told by your doctor. °· Apply ice to the affected area for 20 minutes. Do this 3-4 times a day for the first 48-72 hours. Then try heat in the same way. °· Exercise, stretch, or do your usual activities if these do not make your pain worse. °· Go to physical therapy as told by your doctor. °· Keep all doctor visits as told. °· Do not wear high heels or shoes that are not supportive. °· Get a firm mattress if your mattress is too soft to lessen pain and discomfort. °GET HELP RIGHT AWAY IF:  °· You cannot control when you poop (bowel movement) or pee (urinate). °· You have more weakness in your lower back, lower belly (pelvis), butt (buttocks), or legs. °· You have redness or puffiness (swelling) of your back. °· You have a burning feeling when you pee. °· You have pain that gets worse when you lie down. °· You have pain that wakes you from your sleep. °· Your pain is worse than past pain. °· Your pain lasts longer than 4 weeks. °· You are suddenly losing weight without reason. °MAKE SURE YOU:  °· Understand these instructions. °· Will watch this condition. °· Will get help right away if you are not doing well or get worse. °Document Released: 10/23/2007 Document Revised: 07/15/2011 Document Reviewed: 05/25/2011 °[employer]® Patient Information ©2015 [employer]. This information is not intended to replace advice given to you by your health care provider. Make sure you discuss any questions you have with your health care provider. ° °

## 2013-07-29 NOTE — ED Notes (Signed)
Low back pain on rt, and down rt thigh,

## 2013-07-29 NOTE — ED Provider Notes (Signed)
CSN: [id number]     Arrival date & time 07/29/13  1400 History   First MD Initiated Contact with Patient 07/29/13 1418    This chart was scribed for Kem Parkinson, PA-C, by Rosary Lively, ED scribe. This patient was seen in room APFT23/APFT23 and the patient's care was started at 2:43 PM.  Chief Complaint  Patient presents with  . Back Pain   Patient is a 28 y.o. male presenting with back pain. The history is provided by the patient. No language interpreter was used.  Back Pain Location:  Sacro-iliac joint Quality:  Burning and stabbing Radiates to:  R knee, R posterior upper leg and R foot Pain severity:  Severe Timing:  Intermittent Progression:  Worsening Chronicity:  Chronic Worsened by:  Movement and lying down Associated symptoms: no abdominal pain, no dysuria, no fever, no numbness and no weakness    HPI Comments:  Alex Mullen is a 28 y.o. male who presents to the Emergency Department complaining of severe, sharp, stabbing, intermitent lower back pain that radiates down to the right foot. Pt states that it is difficult for him to get comfortable, and is made worse with activity. Pt reports that a heat pack sometimes modifies pain.Pt states that he injured his back several years ago, and has had persistent issues with pain. Pt reports that he helped to lift a lawn mower a few days ago, which also caused a flare up of pain in his lower back. Pt states that he was previously involved in physical therapy, but discontinued treatment. Pt reports that he has attempted to make an appointment, but needs a referral from PCP. Pt reports that he has an appointment scheduled for 08/16/13 to get a referral from the Health Department. Pt denies abdominal pain, incontinence, or dysuria. Pt states that he can not tolerate Vicodin.     Past Medical History  Diagnosis Date  . 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 Shingles   . Chronic back pain   . Sciatica of right side    Past Surgical History   Procedure Laterality Date  . Facial fracture surgery  2006   History reviewed. No pertinent family history. History  Substance Use Topics  . Smoking status: Current Every Day Smoker -- 1.00 packs/day for 10 years    Types: Cigarettes  . Smokeless tobacco: Current User    Types: Snuff  . Alcohol Use: No    Review of Systems  Constitutional: Negative for fever.  Respiratory: Negative for shortness of breath.   Gastrointestinal: Negative for vomiting, abdominal pain and constipation.  Genitourinary: Negative for dysuria, hematuria, flank pain, decreased urine volume and difficulty urinating.       No perineal numbness or incontinence of urine or feces  Musculoskeletal: Positive for back pain. Negative for joint swelling.  Skin: Negative for rash.  Neurological: Negative for weakness and numbness.  All other systems reviewed and are negative.     Allergies  Penicillins; Doxycycline; Hydrocodone; and Robaxin  Home Medications   Prior to Admission medications   Medication Sig Start Date End Date Taking? Authorizing Provider  cyclobenzaprine (FLEXERIL) 10 MG tablet Take 1 tablet (10 mg total) by mouth 3 (three) times daily as needed for muscle spasms. 06/17/13   Alfonzo Feller, DO  naproxen (NAPROSYN) 250 MG tablet Take 1 tablet (250 mg total) by mouth 2 (two) times daily as needed for mild pain or moderate pain. 07/10/13   Virgel Manifold, MD  oxyCODONE-acetaminophen (PERCOCET/ROXICET) 5-325 MG per tablet Take 1  tablet by mouth every 6 (six) hours as needed for severe pain. 07/10/13   Virgel Manifold, MD   BP 159/95  Pulse 100  Temp(Src) 97.8 F (36.6 C) (Oral)  Resp 14  Ht 5\' 10"  (1.778 m)  Wt 190 lb (86.183 kg)  BMI 27.26 kg/m2  SpO2 99% Physical Exam  Nursing note and vitals reviewed. Constitutional: He is oriented to person, place, and time. He appears well-developed and well-nourished. No distress.  HENT:  Head: Normocephalic and atraumatic.  Eyes: EOM are normal.   Neck: Normal range of motion. Neck supple.  Cardiovascular: Normal rate, regular rhythm, normal heart sounds and intact distal pulses.   No murmur heard. Pulmonary/Chest: Effort normal and breath sounds normal. No respiratory distress.  Abdominal: Soft. He exhibits no distension. There is no tenderness. There is no rebound and no guarding.  Musculoskeletal: Normal range of motion. He exhibits tenderness. He exhibits no edema.       Lumbar back: He exhibits tenderness and pain. He exhibits normal range of motion, no swelling, no deformity, no laceration and normal pulse.  Tenderness to palpation, the right lumbar paraspinal muscles and SI joint. Pain is reproduced with straight leg raise on the right. Pt has 5/5 strength against resistance of the bilateral lower extremities. No edema, or calf pain.   Neurological: He is alert and oriented to person, place, and time. He has normal strength. No sensory deficit. He exhibits normal muscle tone. Coordination and gait normal.  Reflex Scores:      Patellar reflexes are 2+ on the right side and 2+ on the left side.      Achilles reflexes are 2+ on the right side and 2+ on the left side. Skin: Skin is warm and dry. No rash noted.  Psychiatric: He has a normal mood and affect. His behavior is normal.    ED Course  Procedures  2:52 PM Patient informed of clinical course, understand medical decision-making process, and agree with plan.    EKG Interpretation None      MDM   Final diagnoses:  Sciatica, right    Sx's likely related to sciatica.  No focal neuro deficits, no concerning sx's for emergent neurological or infectious process.  Pt has upcoming appt with health dept .  He agrees to sx tx and appears stable for d/c.    I personally performed the services described in this documentation, which was scribed in my presence. The recorded information has been reviewed and is accurate   Osa Fogarty L. Vanessa Greenwood, PA-C 07/31/13 1824

## 2013-07-29 NOTE — ED Notes (Signed)
Two days of lower back pain w/radiation of pain to toes.  Injured back years ago and has had intermittent problems since.  Had fall from ladder couple months ago which has caused flare up of pain.  Narcotic pain medication he received in the past helped, Naproxsyn and Tylenol didn't help.  Heat occasionally helps, but lying down makes it worse.

## 2013-08-01 NOTE — ED Provider Notes (Signed)
Medical screening examination/treatment/procedure(s) were performed by non-physician practitioner and as supervising physician I was immediately available for consultation/collaboration.   EKG Interpretation None        Sharyon Cable, MD 08/01/13 [phone]

## 2014-05-10 ENCOUNTER — Ambulatory Visit (HOSPITAL_COMMUNITY): Payer: Medicaid Other | Attending: Neurosurgery | Admitting: Physical Therapy

## 2014-05-11 ENCOUNTER — Ambulatory Visit (HOSPITAL_COMMUNITY): Payer: Medicaid Other | Admitting: Physical Therapy

## 2014-06-12 IMAGING — CR DG CHEST 2V
2 series · 2 of 2 positions shown · non-contrast
Comparison: 01/22/2012

CLINICAL DATA: Trauma secondary to a fall from a ladder. Back pain.

EXAM:
CHEST  2 VIEW

[view not recorded (1 of 2)]
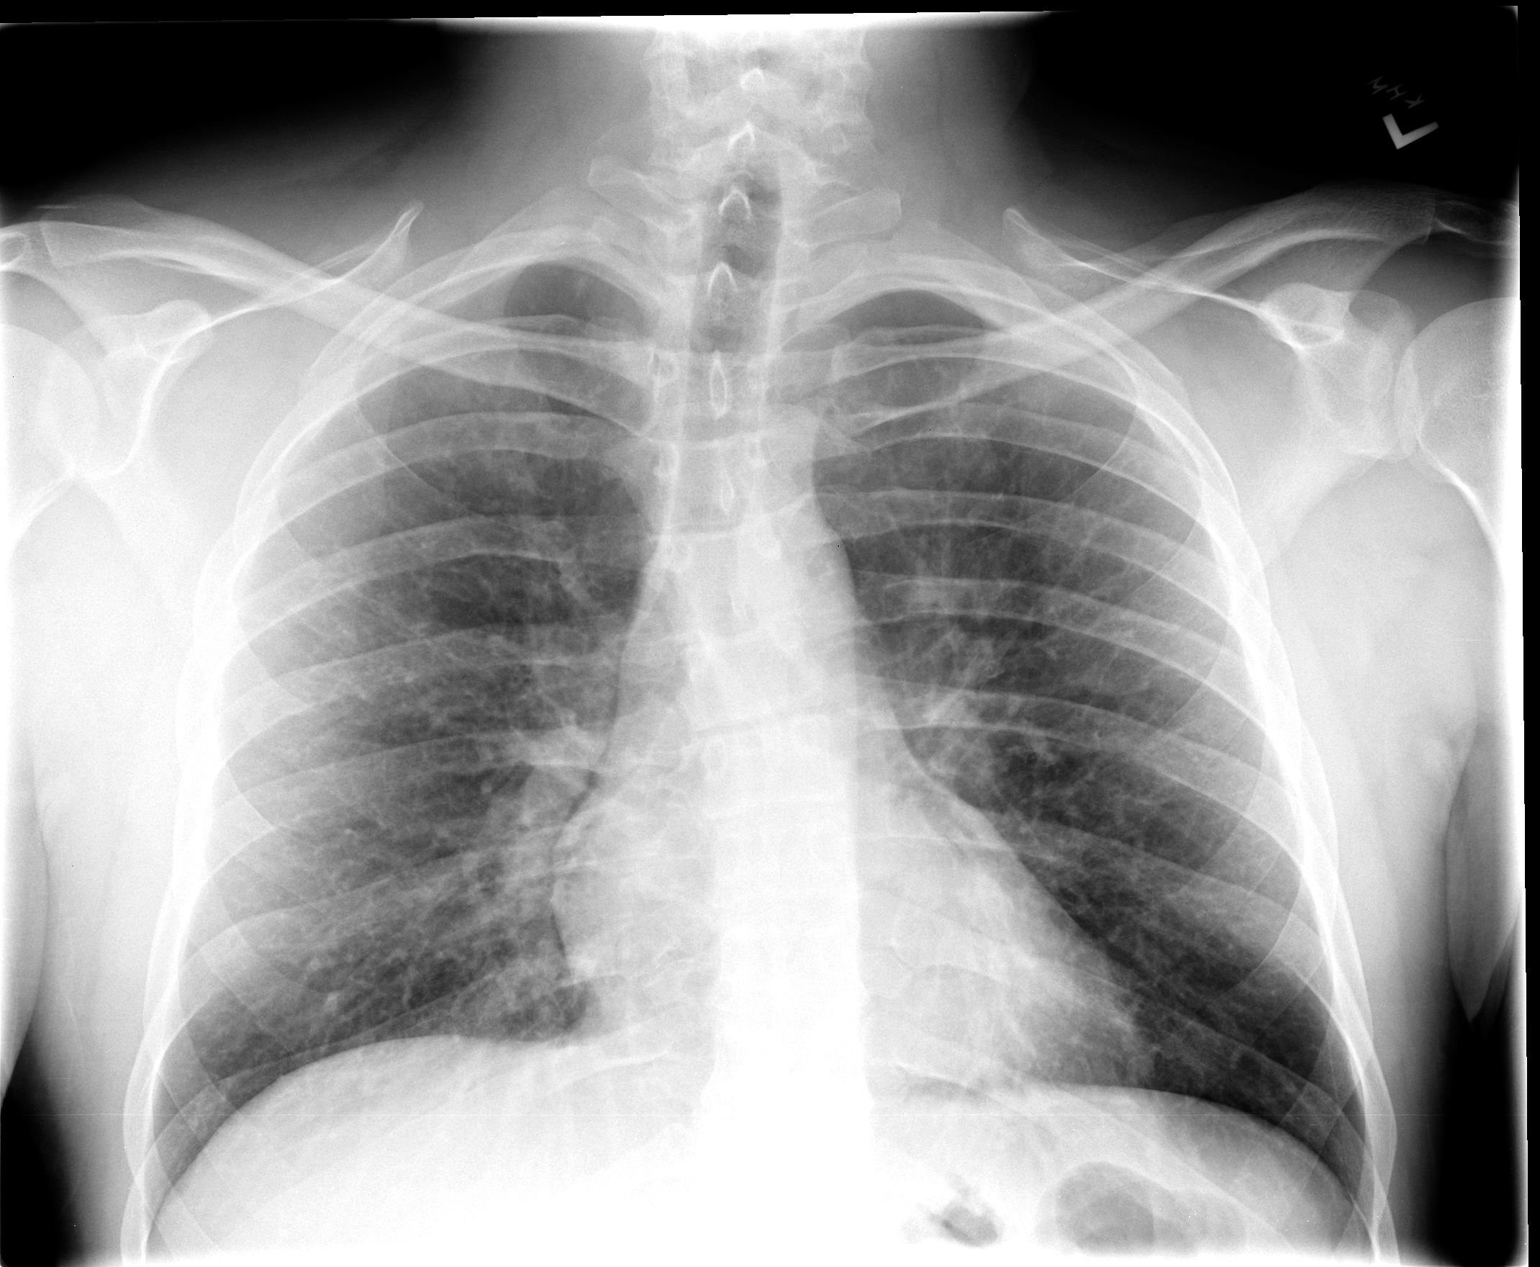

[view not recorded (2 of 2)]
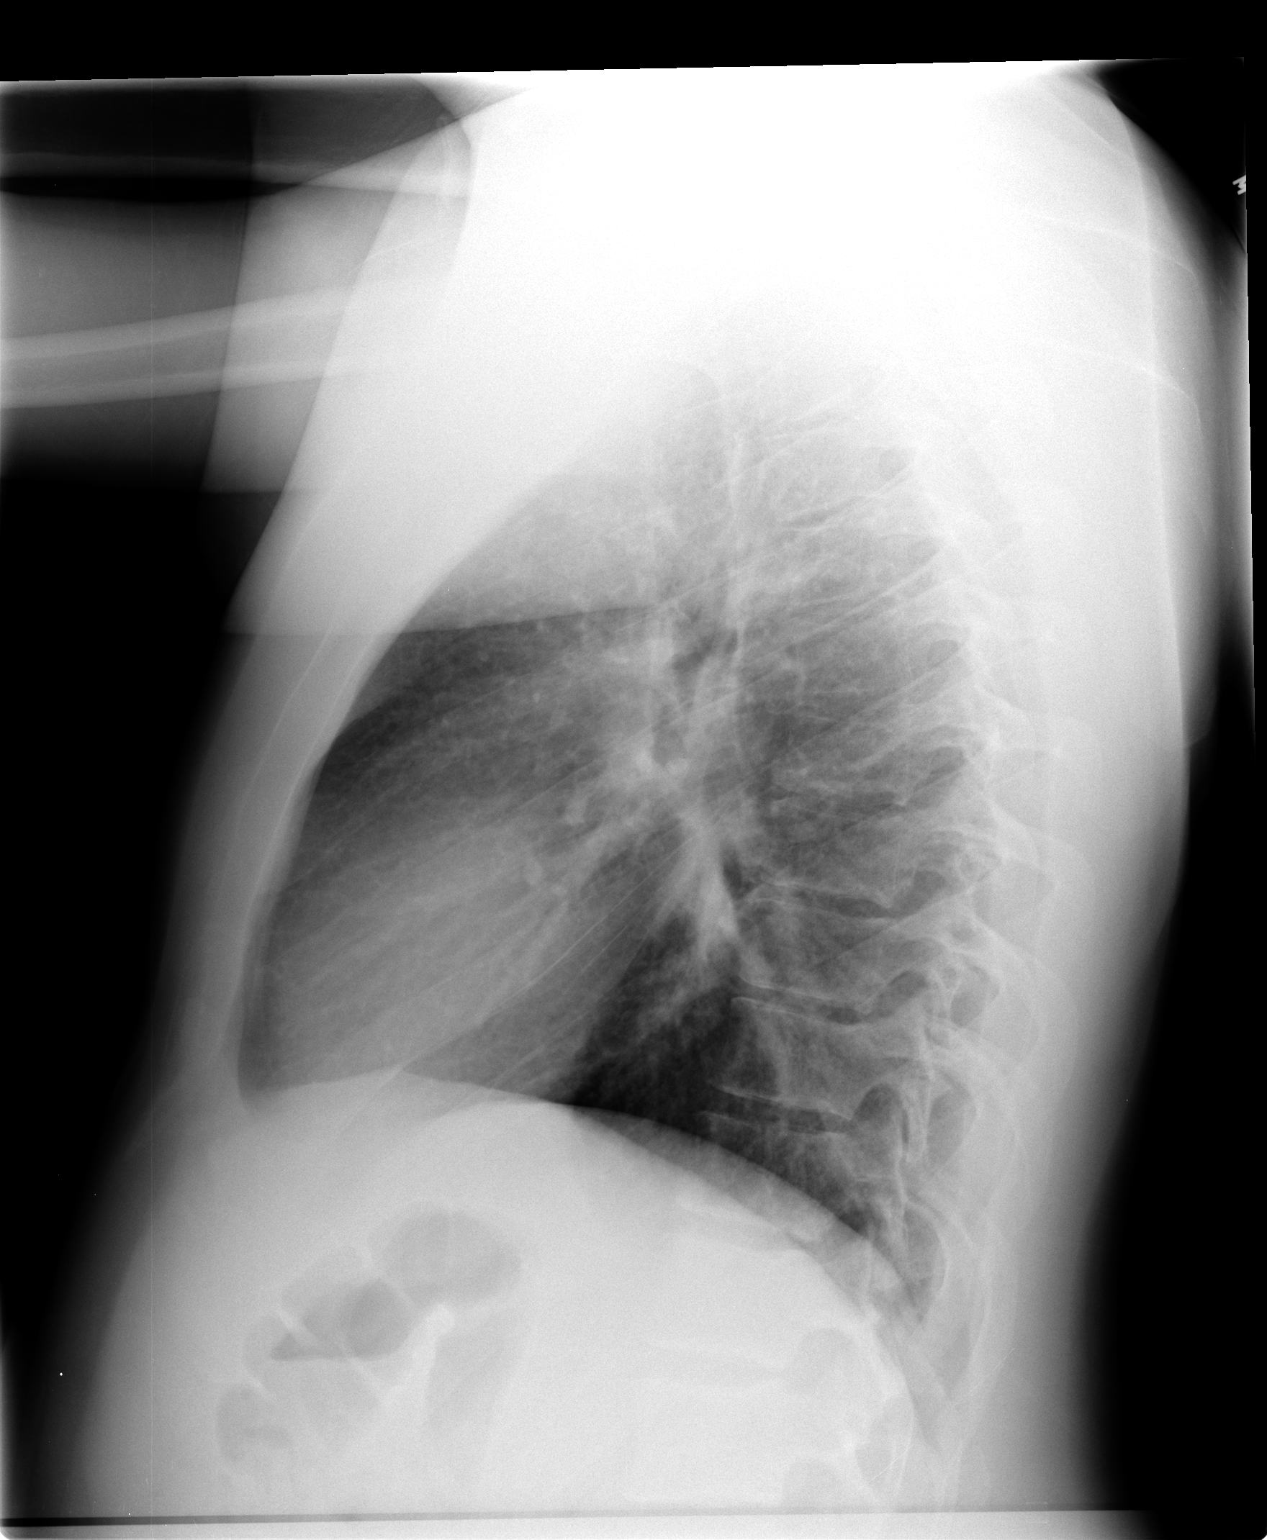

[2 of 2 positions shown; findings below may reference images not displayed]

FINDINGS: Heart size and pulmonary vascularity are normal and the lungs are
clear. No pneumothorax or pleural effusion. Slight thoracic
scoliosis, stable.
IMPRESSION: No acute abnormalities.

## 2014-07-05 IMAGING — CR DG ANKLE COMPLETE 3+V*L*
3 series · 3 of 3 positions shown · non-contrast
Comparison: Ankle series [DATE] 08/15/2012.

CLINICAL DATA: Pain.

EXAM:
LEFT ANKLE COMPLETE - 3+ VIEW

[view not recorded (1 of 3)]
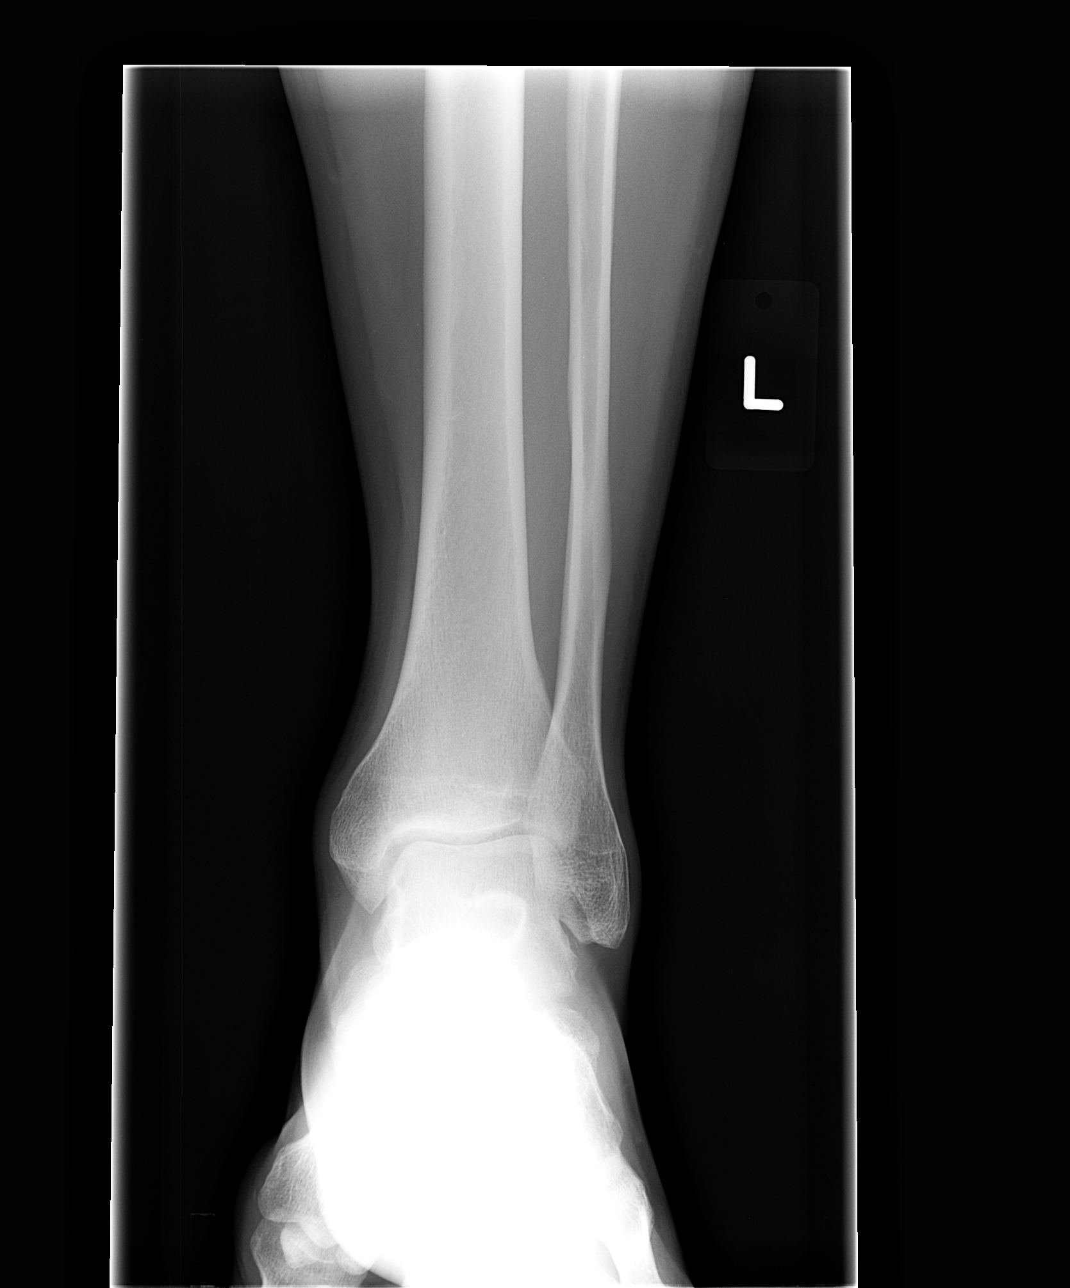

[view not recorded (2 of 3)]
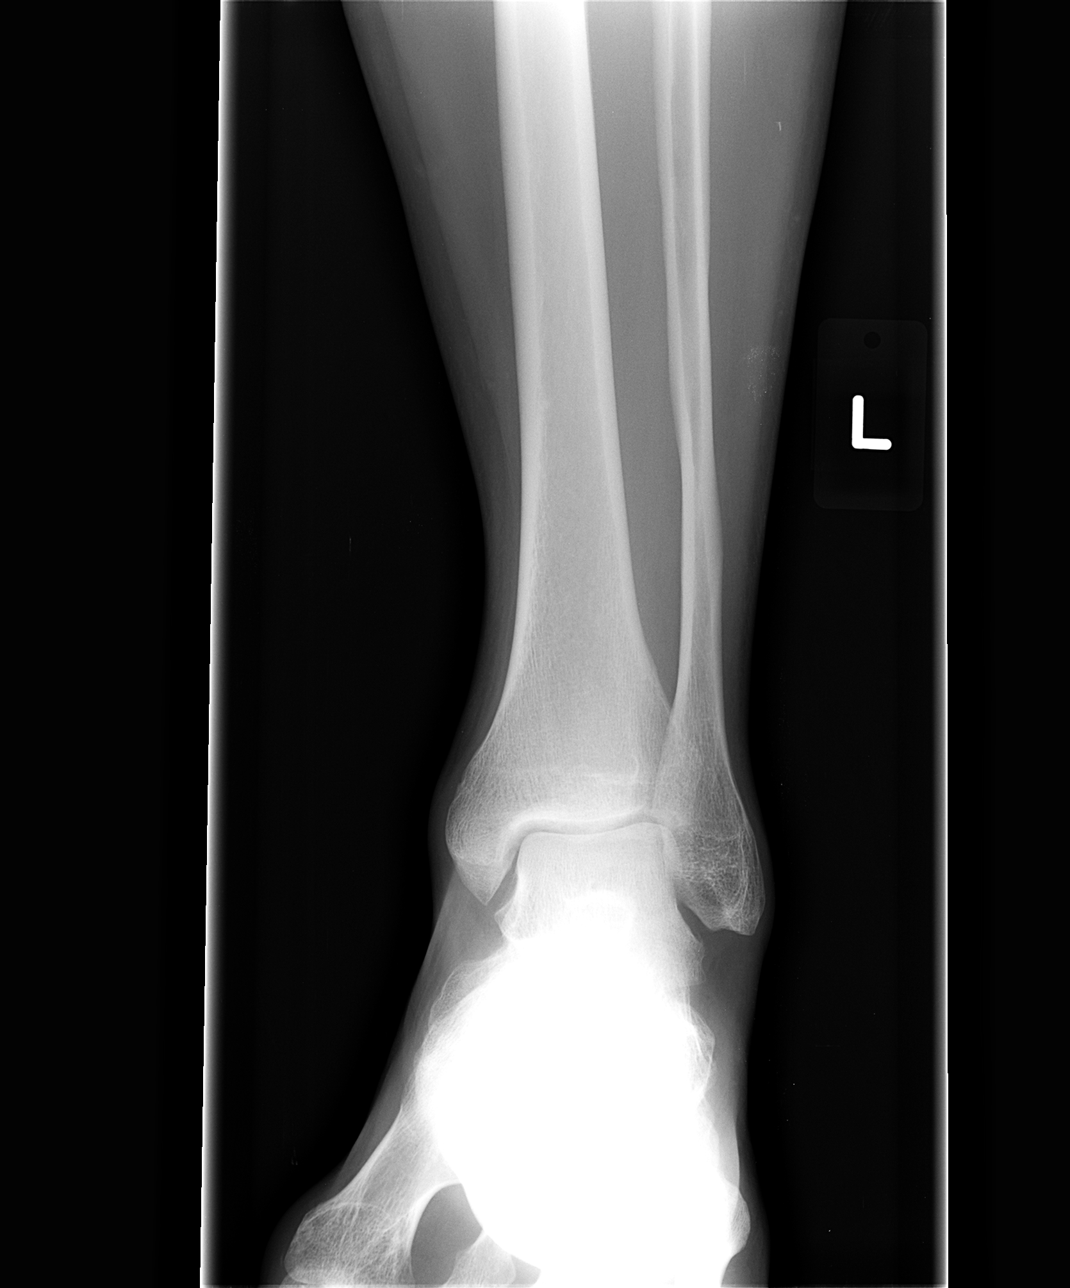

[view not recorded (3 of 3)]
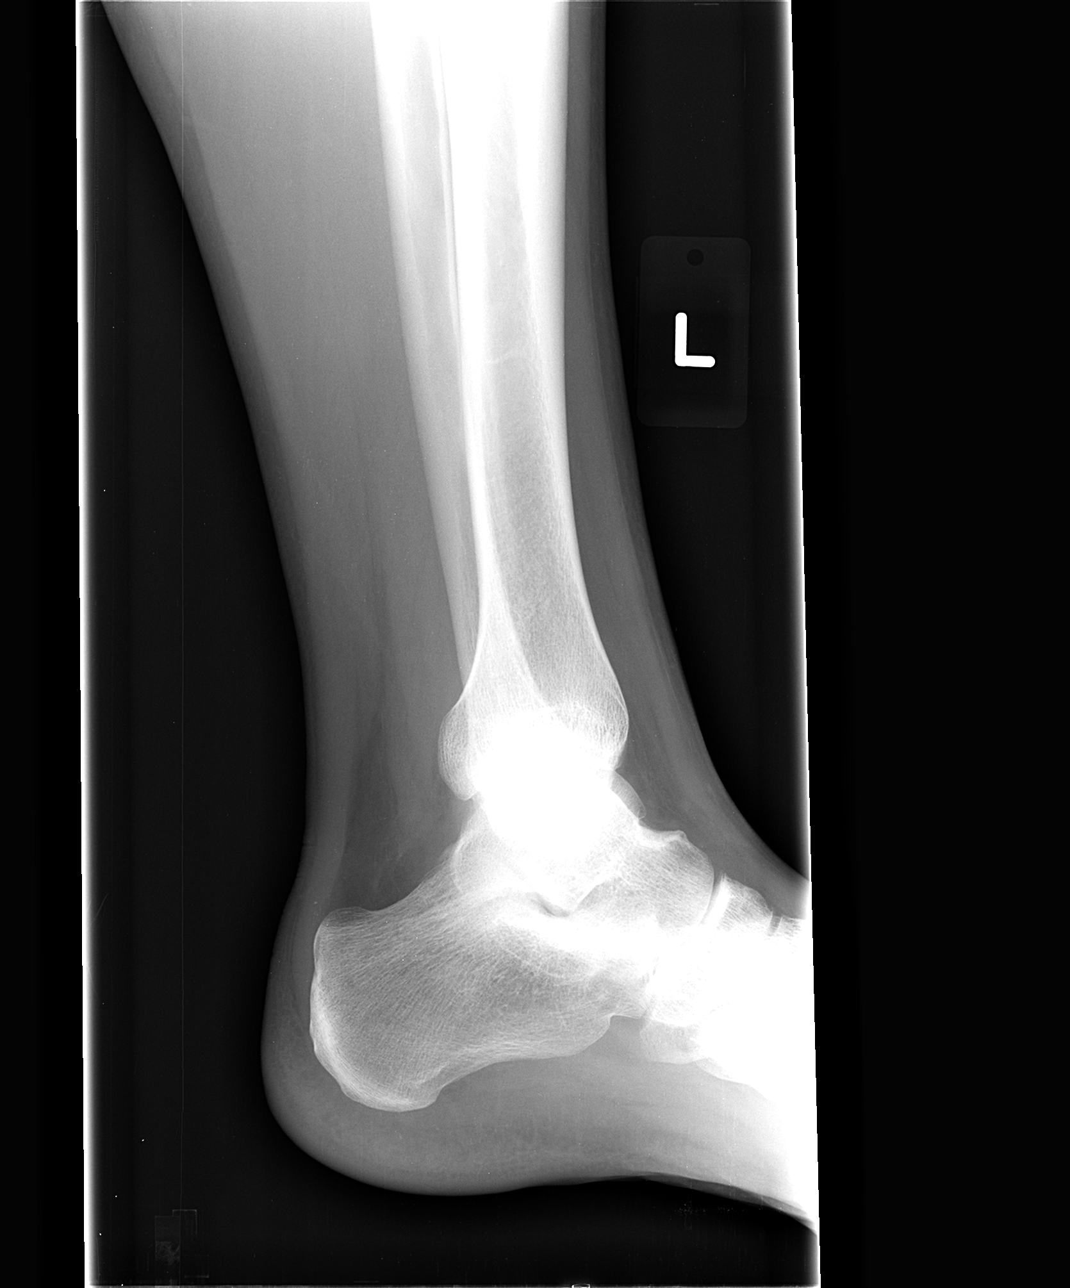

[3 of 3 positions shown; findings below may reference images not displayed]

FINDINGS: Tiny bony density noted adjacent to the tip of the lateral
malleolus. Tiny avulsion fracture cannot be entirely excluded. This
may be old. No significant soft tissue swelling is noted in this
region. No other abnormality identified.
IMPRESSION: Punctate bony density noted adjacent to the tip of the lateral
malleus. Tiny avulsion fracture cannot be entirely excluded. This
may be old. No significant soft tissue swelling in this region
noted. No acute bony abnormality otherwise noted .

## 2015-03-15 ENCOUNTER — Encounter: Payer: Self-pay | Admitting: Orthopedic Surgery

## 2015-03-15 ENCOUNTER — Ambulatory Visit (INDEPENDENT_AMBULATORY_CARE_PROVIDER_SITE_OTHER): Payer: Medicaid Other | Admitting: Orthopaedic Surgery

## 2015-03-15 ENCOUNTER — Encounter: Payer: Self-pay | Admitting: Orthopaedic Surgery

## 2015-03-15 VITALS — BP 129/83 | HR 74 | Temp 97.7°F | Resp 18 | Ht 67.2 in | Wt 206.0 lb

## 2015-03-15 DIAGNOSIS — M542 Cervicalgia: Secondary | ICD-10-CM

## 2015-03-15 MED ORDER — OXYCODONE-ACETAMINOPHEN 7.5-325 MG PO TABS: 1.0000 | ORAL_TABLET | ORAL | Status: DC | PRN

## 2015-03-15 NOTE — Patient Instructions (Signed)
Referral to Dr. Carloyn Manner

## 2015-03-15 NOTE — Progress Notes (Signed)
Patient Alex Mullen, male DOB:01-Jun-1985, 30 y.o. [id number]  Chief Complaint  Patient presents with  . Follow-up    follow up neck pain    HPI  Alex Mullen is a 30 y.o. male who has chronic pain of the neck with right sided paresthesias.  He has been getting worse with right long and index finger pain and numbness.  It keeps him up at night and he is not sleeping well. He has tried pain medicine, NSAIDs, ice, heat with no help.  He is getting much worse.  He has no new trauma.  He has seen Dr. Carloyn Manner in the past. HPI  Body mass index is 32.07 kg/(m^2).  Review of Systems  Patient does not have Diabetes Mellitus. Patient does not have hypertension. Patient does not have COPD or shortness of breath. Patient does not have BMI > 35. Patient has current smoking history.  Review of Systems  Respiratory:       Current smoker  Musculoskeletal: Positive for back pain, neck pain and neck stiffness.  Psychiatric/Behavioral: Positive for sleep disturbance.  All other systems reviewed and are negative.   Past Medical History  Diagnosis Date  . 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 Shingles   . Chronic back pain   . Sciatica of right side     Past Surgical History  Procedure Laterality Date  . Facial fracture surgery  2006    No family history on file.  Social History Social History  Substance Use Topics  . Smoking status: Current Every Day Smoker -- 1.00 packs/day for 10 years    Types: Cigarettes  . Smokeless tobacco: Current User    Types: Snuff  . Alcohol Use: No    Allergies  Allergen Reactions  . Penicillins Other (See Comments)    Childhood Allergy  . Doxycycline Rash  . Hydrocodone Rash    Makes him "red"  . Robaxin [Methocarbamol] Rash    Current Outpatient Prescriptions  Medication Sig Dispense Refill  . oxyCODONE-acetaminophen (PERCOCET) 7.5-325 MG tablet Take 1 tablet by mouth every 4 (four) hours as needed for moderate pain or severe pain (Must  last 30 days.Do not take and drive a car or operate machinery). 120 tablet 0   No current facility-administered medications for this visit.     Physical Exam  Blood pressure 129/83, pulse 74, temperature 97.7 F (36.5 C), resp. rate 18, height 5' 7.2" (1.707 m), weight 206 lb (93.441 kg).  Constitutional: overall normal hygiene, normal nutrition, well developed, normal grooming, normal body habitus. Assistive device:none  Musculoskeletal: gait and station Limp none, muscle tone and strength are normal, no tremors or atrophy is present.  .  Neurological: coordination overall normal.  Deep tendon reflex/nerve stretch intact.  Sensation normal.  Cranial nerves II-XII intact.   Skin:   normal overall no scars, lesions, ulcers or rashes. No psoriasis.  Psychiatric: Alert and oriented x 3.  Recent memory intact, remote memory unclear.  Normal mood and affect. Well groomed.  Good eye contact.  Cardiovascular: overall no swelling, no varicosities, no edema bilaterally, normal temperatures of the legs and arms, no clubbing, cyanosis and good capillary refill.  Lymphatic: palpation is normal.   Extremities:he has pain in the neck area, more on the right with no trapezius involvement.  He has paresthesias to the long and index fingers on the right.  Sensation is intact but he has slight decrease in the C6 dermatome. Inspection normal Strength and tone normal Range of motion normal but tenderness of  the right nuchal area  Additional services performed: I reviewed his MRI of 03/27/2014 done at [facility].  He had the disc with him and I also went over the report from that examination.  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  I think it has progressed since last year.  Previously he had seen Dr. Carloyn Manner, neurosurgeon in [place].  He was referred for blocks to Dr. Francesco Runner but he never saw Dr. Francesco Runner.  I have asked that he see Dr. Carloyn Manner again.   He may need a new MRI of his neck.  I will refill his pain medicine.  I also told him about cutting back on smoking.  He is not willing to stop it.  The patient has been educated about the nature of the problem(s) and counseled on treatment options.  The patient appeared to understand what I have discussed and is in agreement with it.  PLAN Call if any problems.  Precautions discussed.  Continue current medications.   Return to clinic one month.

## 2015-03-20 ENCOUNTER — Telehealth: Payer: Self-pay | Admitting: *Deleted

## 2015-03-20 NOTE — Telephone Encounter (Signed)
I spoke with Linwood Dibbles at [facility] Department and advised that due to the patient having [place] [employer] [place] Access they would need to make the referral to Dr. Carloyn Manner. I faxed office notes to her.

## 2015-03-21 ENCOUNTER — Telehealth: Payer: Self-pay | Admitting: *Deleted

## 2015-03-21 NOTE — Telephone Encounter (Signed)
LEFT MESSAGE THAT REFERRAL TO DR. Carloyn Manner WILL HAVE TO COME FROM HEALTH DEPARTMENT DUE TO HAVING MEDICAID [place] ACCESS

## 2015-04-05 ENCOUNTER — Ambulatory Visit (INDEPENDENT_AMBULATORY_CARE_PROVIDER_SITE_OTHER): Payer: Medicaid Other | Admitting: Orthopaedic Surgery

## 2015-04-05 ENCOUNTER — Encounter: Payer: Self-pay | Admitting: Orthopaedic Surgery

## 2015-04-05 VITALS — BP 147/61 | HR 96 | Temp 99.9°F | Ht 67.0 in | Wt 206.0 lb

## 2015-04-05 DIAGNOSIS — M542 Cervicalgia: Secondary | ICD-10-CM

## 2015-04-05 NOTE — Progress Notes (Signed)
Patient Alex Mullen, male DOB:Sep 30, 1985, 30 y.o. [id number]  Chief Complaint  Patient presents with  . Follow-up    HPI  Alex Mullen is a 30 y.o. male who has chronic neck pain.  He has been trying to set up an appointment with Dr. Carloyn Manner the neurosurgeon.  Due to his insurance and getting approval, it has been delayed again.  He has an appointment scheduled the end of this month.  He has no new trauma.  He continues to have neck pain.  He has no spasm.  He has not been successful with NSAIDs and heat/ice/massage  HPI  Body mass index is 32.26 kg/(m^2).   Review of Systems  Constitutional:       Patient does not have Diabetes Mellitus. Patient does not have hypertension. Patient does not have COPD or shortness of breath. Patient does not have BMI > 35. Patient has current smoking history.  HENT: Negative.   Eyes: Negative.   Respiratory:       Current smoker  Cardiovascular: Negative.   Gastrointestinal: Negative.   Endocrine: Negative.   Genitourinary: Negative.   Musculoskeletal: Positive for back pain, neck pain and neck stiffness.  Allergic/Immunologic: Negative.   Hematological: Negative.   Psychiatric/Behavioral: Positive for sleep disturbance.  All other systems reviewed and are negative.   Past Medical History  Diagnosis Date  . 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 Shingles   . Chronic back pain   . Sciatica of right side     Past Surgical History  Procedure Laterality Date  . Facial fracture surgery  2006    No family history on file.  Social History Social History  Substance Use Topics  . Smoking status: Current Every Day Smoker -- 1.00 packs/day for 10 years    Types: Cigarettes  . Smokeless tobacco: Current User    Types: Snuff  . Alcohol Use: No    Allergies  Allergen Reactions  . Penicillins Other (See Comments)    Childhood Allergy  . Doxycycline Rash  . Hydrocodone Rash    Makes him "red"  . Robaxin [Methocarbamol] Rash     Current Outpatient Prescriptions  Medication Sig Dispense Refill  . oxyCODONE-acetaminophen (PERCOCET) 7.5-325 MG tablet Take 1 tablet by mouth every 4 (four) hours as needed for moderate pain or severe pain (Must last 30 days.Do not take and drive a car or operate machinery). 120 tablet 0   No current facility-administered medications for this visit.     Physical Exam  Blood pressure 147/61, pulse 96, temperature 99.9 F (37.7 C), height 5\' 7"  (1.702 m), weight 206 lb (93.441 kg).  Constitutional: overall normal hygiene, normal nutrition, well developed, normal grooming, normal body habitus. Assistive device:none  Musculoskeletal: gait and station Limp none, muscle tone and strength are normal, no tremors or atrophy is present.  .  Neurological: coordination overall normal.  Deep tendon reflex/nerve stretch intact.  Sensation normal.  Cranial nerves II-XII intact.   Skin:   normal overall no scars, lesions, ulcers or rashes. No psoriasis.  Psychiatric: Alert and oriented x 3.  Recent memory intact, remote memory unclear.  Normal mood and affect. Well groomed.  Good eye contact.  Cardiovascular: overall no swelling, no varicosities, no edema bilaterally, normal temperatures of the legs and arms, no clubbing, cyanosis and good capillary refill.  Lymphatic: palpation is normal.   His neck is tender more on the right posterior side.  He has no spasm.  He has good motion today but more tender to the  right.  NV is intact.  ROM of the right shoulder is full.  Grips are normal   The patient has been educated about the nature of the problem(s) and counseled on treatment options.  The patient appeared to understand what I have discussed and is in agreement with it.  PLAN Call if any problems.  Precautions discussed.  Continue current medications.   Return to clinic 2 months

## 2015-04-12 ENCOUNTER — Telehealth: Payer: Self-pay | Admitting: Orthopaedic Surgery

## 2015-04-12 ENCOUNTER — Other Ambulatory Visit: Payer: Self-pay | Admitting: *Deleted

## 2015-04-12 MED ORDER — OXYCODONE-ACETAMINOPHEN 7.5-325 MG PO TABS: 1.0000 | ORAL_TABLET | ORAL | Status: DC | PRN

## 2015-04-12 NOTE — Telephone Encounter (Signed)
Call received from patient for refill on medication: oxyCODONE-acetaminophen (PERCOCET) 7.5-325 MG tablet [id number] - Quantity 120 / Take 1 tablet by mouth every 4 (four) hours as needed. Patient has been referred to Dr Carloyn Manner, neurosurgeon, and appointment is pending - he relays his primary care, [facility], has scheduled him for an appointment in about 2 weeks, as they need to directly refer per his Medicaid plan.  Please advise regarding refill - ph# [phone]

## 2015-04-22 ENCOUNTER — Encounter (HOSPITAL_COMMUNITY): Payer: Self-pay | Admitting: Emergency Medicine

## 2015-04-22 ENCOUNTER — Emergency Department (HOSPITAL_COMMUNITY)
Admission: EM | Admit: 2015-04-22 | Discharge: 2015-04-22 | Disposition: A | Discharge status: Home / Self Care | Payer: Medicaid Other | Attending: Emergency Medicine | Admitting: Emergency Medicine

## 2015-04-22 DIAGNOSIS — L0201 Cutaneous abscess of face: Secondary | ICD-10-CM | POA: Diagnosis present

## 2015-04-22 DIAGNOSIS — F17210 Nicotine dependence, cigarettes, uncomplicated: Secondary | ICD-10-CM | POA: Diagnosis not present

## 2015-04-22 DIAGNOSIS — B029 Zoster without complications: Secondary | ICD-10-CM | POA: Insufficient documentation

## 2015-04-22 DIAGNOSIS — M542 Cervicalgia: Secondary | ICD-10-CM | POA: Diagnosis not present

## 2015-04-22 DIAGNOSIS — Z79891 Long term (current) use of opiate analgesic: Secondary | ICD-10-CM | POA: Insufficient documentation

## 2015-04-22 MED ORDER — HYDROMORPHONE HCL 2 MG PO TABS: 2.0000 mg | ORAL_TABLET | Freq: Four times a day (QID) | ORAL | Status: DC | PRN

## 2015-04-22 MED ORDER — TETRACAINE HCL 0.5 % OP SOLN
2.0000 [drp] | Freq: Once | OPHTHALMIC | Status: AC
  Administered 2015-04-22: 2 [drp] via OPHTHALMIC
  Filled 2015-04-22: qty 4

## 2015-04-22 MED ORDER — GABAPENTIN 100 MG PO CAPS: 100.0000 mg | ORAL_CAPSULE | Freq: Three times a day (TID) | ORAL | Status: DC

## 2015-04-22 MED ORDER — FLUORESCEIN SODIUM 1 MG OP STRP
1.0000 | ORAL_STRIP | Freq: Once | OPHTHALMIC | Status: AC
  Administered 2015-04-22: 1 via OPHTHALMIC
  Filled 2015-04-22: qty 1

## 2015-04-22 MED ORDER — HYDROMORPHONE HCL 1 MG/ML IJ SOLN
1.0000 mg | Freq: Once | INTRAMUSCULAR | Status: AC
  Administered 2015-04-22: 1 mg via INTRAMUSCULAR
  Filled 2015-04-22: qty 1

## 2015-04-22 MED ORDER — VALACYCLOVIR HCL 1 G PO TABS: 1000.0000 mg | ORAL_TABLET | Freq: Three times a day (TID) | ORAL | Status: DC

## 2015-04-22 NOTE — ED Provider Notes (Signed)
CSN: [id number]     Arrival date & time 04/22/15  1650 History   First MD Initiated Contact with Patient 04/22/15 1707     Chief Complaint  Patient presents with  . Abscess     Patient is a 30 y.o. male presenting with abscess. The history is provided by the patient.  Abscess Patient presents with facial pain swelling and rash. Began with tingling on the side of the face yesterday and now with the rash and swelling. History of shingles in the same spot. No fevers. Patient states the pain is severe. States there is some slight aching with the eye. No fevers or chills. No trauma. Patient is on chronic pain medicine for his chronic neck pain.  Past Medical History  Diagnosis Date  . 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 Shingles   . Chronic back pain   . Sciatica of right side    Past Surgical History  Procedure Laterality Date  . Facial fracture surgery  2006   History reviewed. No pertinent family history. Social History  Substance Use Topics  . Smoking status: Current Every Day Smoker -- 1.00 packs/day for 10 years    Types: Cigarettes  . Smokeless tobacco: Current User    Types: Snuff  . Alcohol Use: No    Review of Systems  Constitutional: Negative for appetite change.  Eyes: Positive for itching. Negative for pain and visual disturbance.  Respiratory: Negative for shortness of breath.   Cardiovascular: Negative for chest pain.  Gastrointestinal: Negative for abdominal pain.  Musculoskeletal: Positive for neck pain.  Skin: Positive for rash and wound.      Allergies  Penicillins; Doxycycline; Hydrocodone; and Robaxin  Home Medications   Prior to Admission medications   Medication Sig Start Date End Date Taking? Authorizing Provider  oxyCODONE-acetaminophen (PERCOCET) 7.5-325 MG tablet Take 1 tablet by mouth every 4 (four) hours as needed for moderate pain or severe pain (Must last 30 days.Do not take and drive a car or operate machinery). 04/12/15  Yes Carole Civil,  MD  gabapentin (NEURONTIN) 100 MG capsule Take 1 capsule (100 mg total) by mouth 3 (three) times daily. 04/22/15   Davonna Belling, MD  HYDROmorphone (DILAUDID) 2 MG tablet Take 1 tablet (2 mg total) by mouth every 6 (six) hours as needed for severe pain. 04/22/15   Davonna Belling, MD  valACYclovir (VALTREX) 1000 MG tablet Take 1 tablet (1,000 mg total) by mouth 3 (three) times daily. 04/22/15   Davonna Belling, MD   BP 144/86 mmHg  Pulse 101  Temp(Src) 97.6 F (36.4 C) (Oral)  Resp 18  Ht 6' (1.829 m)  Wt 206 lb (93.441 kg)  BMI 27.93 kg/m2  SpO2 99% Physical Exam  Constitutional: He appears well-developed.  HENT:  Vesicular scabbed lesions on the left temporal area. Some erythema and swelling. No fluctuance.j  Eyes: EOM are normal. Pupils are equal, round, and reactive to light.  Left eye stained with floor seen without uptake.  Cardiovascular: Normal rate.   Pulmonary/Chest: Effort normal.  Neurological: He is alert.  Skin: Skin is warm.    ED Course  Procedures (including critical care time) Labs Review Labs Reviewed - No data to display  Imaging Review No results found. I have personally reviewed and evaluated these images and lab results as part of my medical decision-making.   EKG Interpretation None      MDM   Final diagnoses:  Zoster    Patient with likely zoster infection of left forehead.  Has had the same in the past. Started with tingling and then developed a rash. Chronic or little early for superinfection at this time. Doubt there is an abscess there. Will treat with valacyclovir and some pain control. Will have follow-up with PCP and ophthalmology. No clear ocular involvement at this time.    Davonna Belling, MD 04/22/15 [phone]

## 2015-04-22 NOTE — Discharge Instructions (Signed)

## 2015-04-22 NOTE — ED Notes (Signed)
Pt states he thinks he may be having a shingles outbreak on left side of face.

## 2015-04-22 NOTE — ED Notes (Signed)
Sprite given per request and EDP's approval.

## 2015-05-08 ENCOUNTER — Telehealth: Payer: Self-pay | Admitting: Orthopaedic Surgery

## 2015-05-08 NOTE — Telephone Encounter (Signed)
Not due until the 17th of this month.  He also got hydro morphine during the month on 3-27 which extends the date to get the Rx until the 20th of this month.  He can only get one narcotic a month from one doctor.  That is the understanding.  He did not call to say he got the other medicine.  I will be decreasing his oxycodone to 5/325 on the 20th.

## 2015-05-08 NOTE — Telephone Encounter (Signed)
Relayed to patient.

## 2015-05-08 NOTE — Telephone Encounter (Signed)
Patient requests refill on medication: oxyCODONE-acetaminophen (PERCOCET) 7.5-325 MG tablet [id number], quantity 120.  Patient states he was recently seen at [facility] emergency room for problem of "shingles" and was not prescribed pain medication, due to being under care of Dr. Luna Glasgow. Please advise.

## 2015-05-14 ENCOUNTER — Telehealth: Payer: Self-pay | Admitting: Orthopaedic Surgery

## 2015-05-14 NOTE — Telephone Encounter (Signed)
Patient called and requested Oxycodone-Acetaminophen (Percocet) 7.5-325 mgs.   Qty  120  Sig: Take 1 tablet by mouth every 4 (four) hours as needed for moderate pain or severe pain (Must last 30 days.Do not take and drive a car or operate machinery).    Last filled on 04-12-15

## 2015-05-14 NOTE — Telephone Encounter (Signed)
He has to wait until 4-20.  He had medicine from another physician filled.  I have commented on this previously.  He will not get 7.5 this time either.  Let front staff know since you did not know this.

## 2015-05-17 ENCOUNTER — Telehealth: Payer: Self-pay | Admitting: Orthopaedic Surgery

## 2015-05-17 MED ORDER — OXYCODONE-ACETAMINOPHEN 5-325 MG PO TABS: 1.0000 | ORAL_TABLET | ORAL | Status: DC | PRN

## 2015-05-17 NOTE — Telephone Encounter (Signed)
Patient requests a refill on Oxycodone-Acetaminophen (percocet) 7.5-325 mgs.  Qty 120         Sig: Take 1 tablet by mouth every 4 (four) hours as needed for moderate pain or severe pain (Must last 30 days.Do not take and drive a car or operate machinery).

## 2015-05-17 NOTE — Telephone Encounter (Signed)
Rx done. 

## 2015-06-05 ENCOUNTER — Ambulatory Visit: Payer: Medicaid Other | Admitting: Orthopaedic Surgery

## 2015-06-14 ENCOUNTER — Encounter: Payer: Self-pay | Admitting: Orthopaedic Surgery

## 2015-06-14 ENCOUNTER — Ambulatory Visit (INDEPENDENT_AMBULATORY_CARE_PROVIDER_SITE_OTHER): Payer: Medicaid Other | Admitting: Orthopaedic Surgery

## 2015-06-14 VITALS — BP 138/94 | HR 103 | Ht 72.0 in | Wt 199.0 lb

## 2015-06-14 DIAGNOSIS — M542 Cervicalgia: Secondary | ICD-10-CM | POA: Diagnosis not present

## 2015-06-14 MED ORDER — OXYCODONE-ACETAMINOPHEN 5-325 MG PO TABS: 1.0000 | ORAL_TABLET | ORAL | Status: DC | PRN

## 2015-06-14 NOTE — Progress Notes (Signed)
Patient [id number] Alex Mullen, male DOB:1985-11-06, 30 y.o. [id number]  Chief Complaint  Patient presents with  . Follow-up    NECK PAIN    HPI  Alex Mullen is a 30 y.o. male who has chronic neck pain and paresthesias at time.  He has been trying to get to see Dr. Carloyn Mullen, the neurosurgeon.  There were delays from his Medicaid and changing primary doctor.  He has finally got an appointment and Dr. Carloyn Mullen changed it to another few weeks.  He is taking his medicine, he is doing his exercises.  He has no new trauma.  HPI  Body mass index is 26.98 kg/(m^2).  ROS  Review of Systems  Constitutional:       Patient does not have Diabetes Mellitus. Patient does not have hypertension. Patient does not have COPD or shortness of breath. Patient does not have BMI > 35. Patient has current smoking history.  HENT: Negative.  Negative for congestion.   Eyes: Negative.   Respiratory: Negative for cough and shortness of breath.        Current smoker  Cardiovascular: Negative.   Gastrointestinal: Negative.   Endocrine: Negative.  Negative for cold intolerance.  Genitourinary: Negative.   Musculoskeletal: Positive for back pain, arthralgias, neck pain and neck stiffness.  Allergic/Immunologic: Negative.  Negative for environmental allergies.  Hematological: Negative.   Psychiatric/Behavioral: Positive for sleep disturbance.  All other systems reviewed and are negative.   Past Medical History  Diagnosis Date  . 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 Shingles   . Chronic back pain   . Sciatica of right side     Past Surgical History  Procedure Laterality Date  . Facial fracture surgery  2006    History reviewed. No pertinent family history.  Social History Social History  Substance Use Topics  . Smoking status: Current Every Day Smoker -- 1.00 packs/day for 10 years    Types: Cigarettes  . Smokeless tobacco: Current User    Types: Snuff  . Alcohol Use: No    Allergies  Allergen  Reactions  . Penicillins Other (See Comments)    Childhood Allergy  . Doxycycline Rash  . Hydrocodone Rash    Makes him "red"  . Robaxin [Methocarbamol] Rash    Current Outpatient Prescriptions  Medication Sig Dispense Refill  . gabapentin (NEURONTIN) 100 MG capsule Take 1 capsule (100 mg total) by mouth 3 (three) times daily. 21 capsule 0  . HYDROmorphone (DILAUDID) 2 MG tablet Take 1 tablet (2 mg total) by mouth every 6 (six) hours as needed for severe pain. 10 tablet 0  . oxyCODONE-acetaminophen (PERCOCET/ROXICET) 5-325 MG tablet Take 1 tablet by mouth every 4 (four) hours as needed for moderate pain or severe pain (Must last 30 days.  Do not drive or operate machinery while taking this medicine). 120 tablet 0  . valACYclovir (VALTREX) 1000 MG tablet Take 1 tablet (1,000 mg total) by mouth 3 (three) times daily. 21 tablet 0   No current facility-administered medications for this visit.     Physical Exam  Blood pressure 138/94, pulse 103, height 6' (1.829 m), weight 199 lb (90.266 kg).  Constitutional: overall normal hygiene, normal nutrition, well developed, normal grooming, normal body habitus. Assistive device:none  Musculoskeletal: gait and station Limp none, muscle tone and strength are normal, no tremors or atrophy is present.  .  Neurological: coordination overall normal.  Deep tendon reflex/nerve stretch intact.  Sensation normal.  Cranial nerves II-XII intact.   Skin:   normal overall no  scars, lesions, ulcers or rashes. No psoriasis.  Psychiatric: Alert and oriented x 3.  Recent memory intact, remote memory unclear.  Normal mood and affect. Well groomed.  Good eye contact.  Cardiovascular: overall no swelling, no varicosities, no edema bilaterally, normal temperatures of the legs and arms, no clubbing, cyanosis and good capillary refill.  Lymphatic: palpation is normal.  Back Exam   Tenderness  The patient is experiencing tenderness in the cervical.  Range of  Motion  The patient has normal back ROM. Extension: normal  Lateral Bend Right: normal  Lateral Bend Left: normal  Rotation Right: normal  Rotation Left: normal   Muscle Strength  The patient has normal back strength.  Tests  Straight leg raise right: negative Straight leg raise left: negative  Reflexes  Patellar: normal Achilles: normal Biceps: normal Babinski's sign: normal   Other  Toe Walk: normal Heel Walk: normal Sensation: normal Gait: normal       The patient has been educated about the nature of the problem(s) and counseled on treatment options.  The patient appeared to understand what I have discussed and is in agreement with it.  Encounter Diagnosis  Name Primary?  . Neck pain Yes    PLAN Call if any problems.  Precautions discussed.  Continue current medications.   Return to clinic 3 months

## 2015-07-12 ENCOUNTER — Telehealth: Payer: Self-pay | Admitting: Orthopaedic Surgery

## 2015-07-12 NOTE — Telephone Encounter (Signed)
Oxycodone-Acetaminophen 5/325mg   Qty 120 Tablets

## 2015-07-16 MED ORDER — HYDROCODONE-ACETAMINOPHEN 7.5-325 MG PO TABS: 1.0000 | ORAL_TABLET | ORAL | Status: DC | PRN

## 2015-07-16 NOTE — Telephone Encounter (Signed)
Rx done. 

## 2015-08-14 ENCOUNTER — Telehealth: Payer: Self-pay | Admitting: Orthopaedic Surgery

## 2015-08-14 NOTE — Telephone Encounter (Signed)
Patient called for refill [id number] (NORCO) 7.5-325 MG tablet [id number] - quantity 120

## 2015-08-15 MED ORDER — HYDROCODONE-ACETAMINOPHEN 7.5-325 MG PO TABS: 1.0000 | ORAL_TABLET | ORAL | Status: DC | PRN

## 2015-08-15 NOTE — Telephone Encounter (Signed)
Rx Done . 

## 2015-09-13 ENCOUNTER — Ambulatory Visit (INDEPENDENT_AMBULATORY_CARE_PROVIDER_SITE_OTHER): Payer: Medicaid Other

## 2015-09-13 ENCOUNTER — Encounter: Payer: Self-pay | Admitting: Orthopaedic Surgery

## 2015-09-13 ENCOUNTER — Ambulatory Visit (INDEPENDENT_AMBULATORY_CARE_PROVIDER_SITE_OTHER): Payer: Medicaid Other | Admitting: Orthopaedic Surgery

## 2015-09-13 VITALS — BP 151/93 | HR 100 | Temp 98.1°F | Ht 72.0 in | Wt 198.4 lb

## 2015-09-13 DIAGNOSIS — M542 Cervicalgia: Secondary | ICD-10-CM

## 2015-09-13 DIAGNOSIS — M79641 Pain in right hand: Secondary | ICD-10-CM

## 2015-09-13 DIAGNOSIS — F17200 Nicotine dependence, unspecified, uncomplicated: Secondary | ICD-10-CM

## 2015-09-13 DIAGNOSIS — Z720 Tobacco use: Secondary | ICD-10-CM | POA: Diagnosis not present

## 2015-09-13 DIAGNOSIS — Z5189 Encounter for other specified aftercare: Secondary | ICD-10-CM | POA: Diagnosis not present

## 2015-09-13 DIAGNOSIS — R52 Pain, unspecified: Secondary | ICD-10-CM

## 2015-09-13 MED ORDER — HYDROCODONE-ACETAMINOPHEN 7.5-325 MG PO TABS: 1.0000 | ORAL_TABLET | ORAL | 0 refills | Status: DC | PRN

## 2015-09-13 NOTE — Progress Notes (Signed)
[name], male DOB:Mar 15, 1985, 30 y.o. [id number]  Chief Complaint  Patient presents with  . Follow-up    Neck and shoulder pain    HPI  Alex Mullen is a 30 y.o. male who has chronic neck pain.  He is to see Dr. Carloyn Manner in [place] next week for the neck.  The pain is chronic with some radiation to the right hand long and index fingers.  He is taking his medicine.  About a week ago he fell and "jammed" his right long finger.  He has had swelling and pain since then.  Motion is full but painful.  He has no redness.   HPI  Body mass index is 26.91 kg/m.  ROS  Review of Systems  Constitutional:       Patient does not have Diabetes Mellitus. Patient does not have hypertension. Patient does not have COPD or shortness of breath. Patient does not have BMI > 35. Patient has current smoking history.  HENT: Negative.  Negative for congestion.   Eyes: Negative.   Respiratory: Negative for cough and shortness of breath.        Current smoker  Cardiovascular: Negative.   Gastrointestinal: Negative.   Endocrine: Negative.  Negative for cold intolerance.  Genitourinary: Negative.   Musculoskeletal: Positive for arthralgias, back pain, neck pain and neck stiffness.  Allergic/Immunologic: Negative.  Negative for environmental allergies.  Hematological: Negative.   Psychiatric/Behavioral: Positive for sleep disturbance.  All other systems reviewed and are negative.   Past Medical History:  Diagnosis Date  . Chronic back pain   . 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 Sciatica of right side   . Shingles     Past Surgical History:  Procedure Laterality Date  . FACIAL FRACTURE SURGERY  2006    History reviewed. No pertinent family history.  Social History Social History  Substance Use Topics  . Smoking status: Current Every Day Smoker    Packs/day: 1.00    Years: 10.00    Types: Cigarettes  . Smokeless tobacco: Current User    Types: Snuff  . Alcohol use No     Allergies  Allergen Reactions  . Penicillins Other (See Comments)    Childhood Allergy  . Doxycycline Rash  . Hydrocodone Rash    Makes him "red"  . Robaxin [Methocarbamol] Rash    Current Outpatient Prescriptions  Medication Sig Dispense Refill  . gabapentin (NEURONTIN) 100 MG capsule Take 1 capsule (100 mg total) by mouth 3 (three) times daily. 21 capsule 0  . HYDROcodone-acetaminophen ([place]) 7.5-325 MG tablet Take 1 tablet by mouth every 4 (four) hours as needed for moderate pain (Must last 30 days.  Do not drive or operate machinery while taking this medicine.). 120 tablet 0  . valACYclovir (VALTREX) 1000 MG tablet Take 1 tablet (1,000 mg total) by mouth 3 (three) times daily. 21 tablet 0   No current facility-administered medications for this visit.      Physical Exam  Blood pressure (!) 151/93, pulse 100, temperature 98.1 F (36.7 C), height 6' (1.829 m), weight 198 lb 6.4 oz (90 kg).  Constitutional: overall normal hygiene, normal nutrition, well developed, normal grooming, normal body habitus. Assistive device:none  Musculoskeletal: gait and station Limp none, muscle tone and strength are normal, no tremors or atrophy is present.  .  Neurological: coordination overall normal.  Deep tendon reflex/nerve stretch intact.  Sensation normal.  Cranial nerves II-XII intact.   Skin:   normal overall no scars, lesions, ulcers or rashes. No  psoriasis.  Psychiatric: Alert and oriented x 3.  Recent memory intact, remote memory unclear.  Normal mood and affect. Well groomed.  Good eye contact.  Cardiovascular: overall no swelling, no varicosities, no edema bilaterally, normal temperatures of the legs and arms, no clubbing, cyanosis and good capillary refill.  Lymphatic: palpation is normal.  His right hand has swelling over the MCP joint and some slight over the dorsal PIP joint with no redness.  ROM is full but tender.  It is stable.  His neck has pain when moving to the  right.  NV intact.  Grip decreased on right secondary to the hand pain.  X-rays were done of the right hand, reported separately.  He is a smoker.  I have talked to him about cutting back or quitting.  He will consider.  The patient has been educated about the nature of the problem(s) and counseled on treatment options.  The patient appeared to understand what I have discussed and is in agreement with it.  Encounter Diagnoses  Name Primary?  . Right hand pain   . Pain management Yes  . Neck pain   . Tobacco smoker within last 12 months     PLAN Call if any problems.  Precautions discussed.  Continue current medications.   Return to clinic 3 months  Electronically Signed Sanjuana Kava, MD 8/17/20174:20 PM

## 2015-09-14 LAB — PAIN MGMT, PROFILE 1 W/O CONF, U
Amphetamines: NEGATIVE ng/mL (ref <500)
BARBITURATES: NEGATIVE ng/mL (ref <300)
BENZODIAZEPINES: POSITIVE ng/mL — AB (ref <100)
COCAINE METABOLITE: NEGATIVE ng/mL (ref <150)
CREATININE: 293.6 mg/dL (ref >=20.0)
MARIJUANA METABOLITE: POSITIVE ng/mL — AB (ref <20)
METHADONE METABOLITE: NEGATIVE ng/mL (ref <100)
OPIATES: POSITIVE ng/mL — AB (ref <100)
OXYCODONE: POSITIVE ng/mL — AB (ref <100)
Oxidant: NEGATIVE ug/mL (ref <200)
Phencyclidine: NEGATIVE ng/mL (ref <25)
pH: 6.99 (ref 4.5–9.0)

## 2015-10-12 ENCOUNTER — Telehealth: Payer: Self-pay | Admitting: Orthopaedic Surgery

## 2015-10-12 NOTE — Telephone Encounter (Signed)
Patient requests a refill on Hydrocodone/Acetaminophen 7.5-325 mgs.  Qty    Sig: Take 1 tablet by mouth every 4 (four) hours as needed for moderate pain (Must last 30 days. Do not drive or operate machinery while taking this medicine.).        THIS PATIENT HAS MEDICAID

## 2015-10-15 MED ORDER — HYDROCODONE-ACETAMINOPHEN 7.5-325 MG PO TABS: ORAL_TABLET | ORAL | 0 refills | Status: DC

## 2015-10-29 ENCOUNTER — Other Ambulatory Visit: Payer: Self-pay | Admitting: *Deleted

## 2015-10-29 ENCOUNTER — Telehealth: Payer: Self-pay | Admitting: Orthopaedic Surgery

## 2015-10-29 MED ORDER — HYDROCODONE-ACETAMINOPHEN 7.5-325 MG PO TABS: ORAL_TABLET | ORAL | 0 refills | Status: DC

## 2015-10-29 NOTE — Telephone Encounter (Signed)
Patient called for refill, pain medication; aware Dr Luna Glasgow out of office this week, and that Dr Aline Brochure is covering medication review/refill: HYDROcodone-acetaminophen (NORCO) 7.5-325 MG tablet 52 tablet 0

## 2015-11-13 ENCOUNTER — Telehealth: Payer: Self-pay | Admitting: Orthopaedic Surgery

## 2015-11-13 NOTE — Telephone Encounter (Signed)
Patient called for refiIl:  HYDROcodone-acetaminophen (NORCO) 7.5-325 MG tablet 52 tablet  - insurance: Medicaid

## 2015-11-14 MED ORDER — HYDROCODONE-ACETAMINOPHEN 7.5-325 MG PO TABS: ORAL_TABLET | ORAL | 0 refills | Status: DC

## 2015-11-27 ENCOUNTER — Telehealth: Payer: Self-pay | Admitting: Orthopaedic Surgery

## 2015-11-27 MED ORDER — HYDROCODONE-ACETAMINOPHEN 7.5-325 MG PO TABS: ORAL_TABLET | ORAL | 0 refills | Status: DC

## 2015-11-27 NOTE — Telephone Encounter (Signed)
Hydrocodone-Acetaminophen 7.5/325mg  Qty 45 Tablets °

## 2015-12-10 ENCOUNTER — Telehealth: Payer: Self-pay | Admitting: Orthopaedic Surgery

## 2015-12-10 NOTE — Telephone Encounter (Signed)
This is a patient of Dr.Keeling's who requests a refill on Hydrocodone/Acetaminophen ([place])  7.5-325 mgs.  Qty  35   Sig: One every six hours for pain as needed. Do not drive car or operate machinery while taking this medicine. Must last 14 days.

## 2015-12-11 ENCOUNTER — Other Ambulatory Visit: Payer: Self-pay | Admitting: *Deleted

## 2015-12-11 MED ORDER — HYDROCODONE-ACETAMINOPHEN 7.5-325 MG PO TABS
ORAL_TABLET | ORAL | 0 refills | Status: DC
Start: 2015-12-11 — End: 2015-12-25

## 2015-12-13 ENCOUNTER — Ambulatory Visit: Payer: Medicaid Other | Admitting: Orthopaedic Surgery

## 2015-12-18 ENCOUNTER — Ambulatory Visit: Payer: Medicaid Other | Admitting: Orthopaedic Surgery

## 2015-12-25 ENCOUNTER — Ambulatory Visit (INDEPENDENT_AMBULATORY_CARE_PROVIDER_SITE_OTHER): Payer: Medicaid Other | Admitting: Orthopaedic Surgery

## 2015-12-25 ENCOUNTER — Encounter: Payer: Self-pay | Admitting: Orthopaedic Surgery

## 2015-12-25 VITALS — BP 143/89 | HR 80 | Ht 69.0 in | Wt 201.0 lb

## 2015-12-25 DIAGNOSIS — M542 Cervicalgia: Secondary | ICD-10-CM | POA: Diagnosis not present

## 2015-12-25 DIAGNOSIS — M79641 Pain in right hand: Secondary | ICD-10-CM | POA: Diagnosis not present

## 2015-12-25 DIAGNOSIS — R52 Pain, unspecified: Secondary | ICD-10-CM

## 2015-12-25 MED ORDER — HYDROCODONE-ACETAMINOPHEN 7.5-325 MG PO TABS: ORAL_TABLET | ORAL | 0 refills | Status: DC

## 2015-12-25 NOTE — Progress Notes (Signed)
Patient [id number] Alex Mullen, male DOB:Sep 04, 1985, 30 y.o. [id number]  Chief Complaint  Patient presents with  . Follow-up    neck pain has been affected by the cold weather    HPI  Alex Mullen is a 30 y.o. male who has chronic pain of his neck. He was to have seen Dr. Carloyn Manner in [place] but had problems with referral from [facility].  He is getting this fixed.  He has no new trauma.  He has no paresthesias today. HPI  Body mass index is 29.68 kg/m.  ROS  Review of Systems  Constitutional:       Patient does not have Diabetes Mellitus. Patient does not have hypertension. Patient does not have COPD or shortness of breath. Patient does not have BMI > 35. Patient has current smoking history.  HENT: Negative.  Negative for congestion.   Eyes: Negative.   Respiratory: Negative for cough and shortness of breath.        Current smoker  Cardiovascular: Negative.   Gastrointestinal: Negative.   Endocrine: Negative.  Negative for cold intolerance.  Genitourinary: Negative.   Musculoskeletal: Positive for arthralgias, back pain, neck pain and neck stiffness.  Allergic/Immunologic: Negative.  Negative for environmental allergies.  Hematological: Negative.   Psychiatric/Behavioral: Positive for sleep disturbance.  All other systems reviewed and are negative.   Past Medical History:  Diagnosis Date  . Chronic back pain   . 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 Sciatica of right side   . Shingles     Past Surgical History:  Procedure Laterality Date  . FACIAL FRACTURE SURGERY  2006    No family history on file.  Social History Social History  Substance Use Topics  . Smoking status: Current Every Day Smoker    Packs/day: 1.00    Years: 10.00    Types: Cigarettes  . Smokeless tobacco: Current User    Types: Snuff  . Alcohol use No    Allergies  Allergen Reactions  . Penicillins Other (See Comments)    Childhood Allergy  . Doxycycline Rash  . Hydrocodone Rash   Makes him "red"  . Robaxin [Methocarbamol] Rash    Current Outpatient Prescriptions  Medication Sig Dispense Refill  . gabapentin (NEURONTIN) 100 MG capsule Take 1 capsule (100 mg total) by mouth 3 (three) times daily. 21 capsule 0  . HYDROcodone-acetaminophen (NORCO) 7.5-325 MG tablet One every six hours for pain as needed.  Do not drive car or operate machinery while taking this medicine.  Must last 14 days. 35 tablet 0  . valACYclovir (VALTREX) 1000 MG tablet Take 1 tablet (1,000 mg total) by mouth 3 (three) times daily. 21 tablet 0   No current facility-administered medications for this visit.      Physical Exam  Blood pressure (!) 143/89, pulse 80, height 5\' 9"  (1.753 m), weight 201 lb (91.2 kg).  Constitutional: overall normal hygiene, normal nutrition, well developed, normal grooming, normal body habitus. Assistive device:none  Musculoskeletal: gait and station Limp none, muscle tone and strength are normal, no tremors or atrophy is present.  .  Neurological: coordination overall normal.  Deep tendon reflex/nerve stretch intact.  Sensation normal.  Cranial nerves II-XII intact.   Skin:   Normal overall no scars, lesions, ulcers or rashes. No psoriasis.  Psychiatric: Alert and oriented x 3.  Recent memory intact, remote memory unclear.  Normal mood and affect. Well groomed.  Good eye contact.  Cardiovascular: overall no swelling, no varicosities, no edema bilaterally, normal temperatures of the legs and  arms, no clubbing, cyanosis and good capillary refill.  Lymphatic: palpation is normal.  He has full motion of the neck today and normal grips.  The patient has been educated about the nature of the problem(s) and counseled on treatment options.  The patient appeared to understand what I have discussed and is in agreement with it.  Encounter Diagnoses  Name Primary?  . Right hand pain Yes  . Pain management   . Neck pain     PLAN Call if any problems.  Precautions  discussed.  Continue current medications.   Return to clinic 2 months   Electronically Signed Sanjuana Kava, MD 11/28/20174:01 PM

## 2016-01-08 ENCOUNTER — Telehealth: Payer: Self-pay | Admitting: Orthopaedic Surgery

## 2016-01-08 NOTE — Telephone Encounter (Signed)
Hydrocodone-Acetaminophen  7.5/325mg  Qty 35 Tablets °

## 2016-01-09 MED ORDER — HYDROCODONE-ACETAMINOPHEN 7.5-325 MG PO TABS: ORAL_TABLET | ORAL | 0 refills | Status: DC

## 2016-01-16 ENCOUNTER — Telehealth: Payer: Self-pay | Admitting: Orthopaedic Surgery

## 2016-01-16 MED ORDER — HYDROCODONE-ACETAMINOPHEN 7.5-325 MG PO TABS: ORAL_TABLET | ORAL | 0 refills | Status: DC

## 2016-01-16 NOTE — Telephone Encounter (Signed)
Hydrocodone-Acetaminophen  7.5/325mg   Qty 30 Tablets

## 2016-02-26 ENCOUNTER — Ambulatory Visit: Payer: Medicaid Other | Admitting: Orthopaedic Surgery

## 2018-07-29 ENCOUNTER — Other Ambulatory Visit: Payer: Self-pay

## 2018-07-29 DIAGNOSIS — Z20822 Contact with and (suspected) exposure to covid-19: Secondary | ICD-10-CM

## 2018-08-05 LAB — NOVEL CORONAVIRUS, NAA: SARS-CoV-2, NAA: NOT DETECTED

## 2018-11-18 ENCOUNTER — Other Ambulatory Visit: Payer: Self-pay

## 2018-11-18 DIAGNOSIS — Z20822 Contact with and (suspected) exposure to covid-19: Secondary | ICD-10-CM

## 2018-11-25 LAB — NOVEL CORONAVIRUS, NAA: SARS-CoV-2, NAA: NOT DETECTED

## 2019-04-04 ENCOUNTER — Encounter: Payer: Self-pay | Admitting: Internal Medicine

## 2019-04-06 ENCOUNTER — Other Ambulatory Visit: Payer: Self-pay

## 2019-04-06 ENCOUNTER — Emergency Department (HOSPITAL_COMMUNITY)
Admission: EM | Admit: 2019-04-06 | Discharge: 2019-04-06 | Disposition: A | Discharge status: Home / Self Care | Payer: BC Managed Care – PPO | Attending: Emergency Medicine | Admitting: Emergency Medicine

## 2019-04-06 ENCOUNTER — Encounter (HOSPITAL_COMMUNITY): Payer: Self-pay

## 2019-04-06 DIAGNOSIS — K625 Hemorrhage of anus and rectum: Secondary | ICD-10-CM | POA: Diagnosis present

## 2019-04-06 DIAGNOSIS — F17210 Nicotine dependence, cigarettes, uncomplicated: Secondary | ICD-10-CM | POA: Insufficient documentation

## 2019-04-06 DIAGNOSIS — Z79899 Other long term (current) drug therapy: Secondary | ICD-10-CM | POA: Insufficient documentation

## 2019-04-06 DIAGNOSIS — K5900 Constipation, unspecified: Secondary | ICD-10-CM | POA: Diagnosis not present

## 2019-04-06 DIAGNOSIS — K640 First degree hemorrhoids: Secondary | ICD-10-CM | POA: Diagnosis not present

## 2019-04-06 LAB — POC OCCULT BLOOD, ED: Fecal Occult Bld: NEGATIVE

## 2019-04-06 MED ORDER — POLYETHYLENE GLYCOL 3350 17 G PO PACK: 17.0000 g | PACK | Freq: Every day | ORAL | 0 refills | Status: DC

## 2019-04-06 MED ORDER — LIDOCAINE-HYDROCORTISONE ACE 1-1 % EX CREA: 1.0000 "application " | TOPICAL_CREAM | Freq: Two times a day (BID) | CUTANEOUS | 0 refills | Status: AC

## 2019-04-06 NOTE — ED Triage Notes (Signed)
Pt reports has noticed bright red blood when he wipes after having a bm for the past couple of months.  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

## 2019-04-06 NOTE — Discharge Instructions (Addendum)
Use the medication prescribed twice daily until your symptoms are resolved, this may take up to 2 weeks.  Avoiding constipation is also important.  I recommend MiraLAX as we discussed.  I have prescribed this for you, although you can buy this without a prescription.  Make sure you are drinking plenty of fluids.  Plan to see Dr. [place] Crane for recheck in 2 weeks if your symptoms persist.  Also, given your fathers history of suspected colon cancer, you definitely need to see Dr. Laural Golden for screening colonoscopy as discussed.  Call him to arrange this follow up with him.

## 2019-04-06 NOTE — ED Provider Notes (Addendum)
[facility] EMERGENCY DEPARTMENT Provider Note   CSN: [id number] Arrival date & time: 04/06/19  [id number]     History Chief Complaint  Patient presents with  . Rectal Bleeding    Alex Mullen is a 34 y.o. male with a history of chronic back pain presenting with rectal bleeding.  He describes seeing bright red blood with wiping after having a bowel movement for the past 1-2 months, also describing rectal irritation and itching.  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  He has had increased problems with constipation and straining to have a bm, reports a less than healthy diet with his current job and probably not staying as hydrated as he should.  He denies abdominal pain, no n/v, unexplained weight loss and no weakness or dizziness.  He was seen by his pcp for this problem and was prescribed a rectal suppository. It was not covered by his insurance cost over $200 so declined picking it up.   The history is provided by the patient.       Past Medical History:  Diagnosis Date  . Chronic back pain   . 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 Sciatica of right side   . Shingles     Patient Active Problem List   Diagnosis Date Noted  . Neck pain 03/15/2015  . Tobacco abuse 03/18/2012  . 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 Herpes zoster keratitis 03/17/2012  . Toe fracture, left 03/17/2012    Past Surgical History:  Procedure Laterality Date  . FACIAL FRACTURE SURGERY  2006       No family history on file.  Social History   Tobacco Use  . Smoking status: Current Every Day Smoker    Packs/day: 1.00    Years: 10.00    Pack years: 10.00    Types: Cigarettes  . Smokeless tobacco: Former Systems developer    Types: Snuff  Substance Use Topics  . Alcohol use: No  . Drug use: No    Home Medications Prior to Admission medications   Medication Sig Start Date End Date Taking? Authorizing  Provider  gabapentin (NEURONTIN) 100 MG capsule Take 1 capsule (100 mg total) by mouth 3 (three) times daily. 04/22/15   Davonna Belling, MD  HYDROcodone-acetaminophen ([place]) 7.5-325 MG tablet One every six hours for pain as needed.  Do not drive car or operate machinery while taking this medicine.  Must last 14 days. 01/16/16   Sanjuana Kava, MD  Lidocaine-Hydrocortisone Ace 1-1 % CREA Apply 1 application topically in the morning and at bedtime. 04/06/19   Evalee Jefferson, PA-C  polyethylene glycol (MIRALAX / GLYCOLAX) 17 g packet Take 17 g by mouth daily. 04/06/19   Carmilla Granville, Almyra Free, PA-C  valACYclovir (VALTREX) 1000 MG tablet Take 1 tablet (1,000 mg total) by mouth 3 (three) times daily. 04/22/15   Davonna Belling, MD    Allergies    Penicillins, Doxycycline, Hydrocodone, and Robaxin [methocarbamol]  Review of Systems   Review of Systems  Constitutional: Negative for appetite change and fever.  HENT: Negative.   Eyes: Negative.   Respiratory: Negative for chest tightness and shortness of breath.   Cardiovascular: Negative for chest pain.  Gastrointestinal: Positive for anal bleeding, constipation and rectal pain. Negative for abdominal distention, abdominal pain and nausea.  Genitourinary: Negative.   Musculoskeletal: Negative for arthralgias, joint swelling and neck pain.  Skin: Negative.  Negative for rash and wound.  Neurological:  Negative for dizziness, weakness, light-headedness, numbness and headaches.  Psychiatric/Behavioral: Negative.     Physical Exam Updated Vital Signs BP 140/87 (BP Location: Right Arm)   Pulse (!) 103   Temp 98.2 F (36.8 C) (Oral)   Resp 18   Ht 5\' 10"  (1.778 m)   Wt 88 kg   SpO2 100%   BMI 27.84 kg/m   Physical Exam Vitals and nursing note reviewed. Exam conducted with a chaperone present.  Constitutional:      Appearance: Normal appearance. He is well-developed.  HENT:     Head: Normocephalic and atraumatic.  Eyes:     Conjunctiva/sclera:  Conjunctivae normal.  Cardiovascular:     Rate and Rhythm: Regular rhythm. Tachycardia present.     Heart sounds: Normal heart sounds.     Comments: Borderline tachy at first presentation, rate normal during exam.  Pulmonary:     Effort: Pulmonary effort is normal.     Breath sounds: Normal breath sounds. No wheezing.  Abdominal:     General: Bowel sounds are normal. There is no distension.     Palpations: Abdomen is soft.     Tenderness: There is no abdominal tenderness. There is no guarding or rebound.  Genitourinary:    Rectum: Guaiac result negative. No anal fissure.     Comments: Small hemorrhoid at the 6 oclock position, no internal hemorrhoid appreciated.  No thrombosis.  Hemoccult collected, no stool, mucoid residue hemoccult negative. Musculoskeletal:        General: Normal range of motion.  Skin:    General: Skin is warm and dry.  Neurological:     Mental Status: He is alert.     ED Results / Procedures / Treatments   Labs (all labs ordered are listed, but only abnormal results are displayed) Labs Reviewed  POC OCCULT BLOOD, ED    EKG None  Radiology No results found.  Procedures Procedures (including critical care time)  Medications Ordered in ED Medications - No data to display  ED Course  I have reviewed the triage vital signs and the nursing notes.  Pertinent labs & imaging results that were available during my care of the patient were reviewed by me and considered in my medical decision making (see chart for details).    MDM Rules/Calculators/A&P                      Pt with small hemorrhoid, hemoccult negative.  Constipation.  No family hx of colon ca, low risk in this 34 year old.  Lidocaine/hydrocortisone topical bid, discussed warm soaks, also ways to avoid constipation, recommended miralax.  Plan recheck by pcp if sx persist beyond the next 2 weeks.  May consider GI referral if sx do not respond to this tx plan.  Discussed with pt prior to dc  home.   At time of dc,  Pt states mother just advised him his birth father died when pt was 2, suspected possibly from colon cancer.  Pt advised will need f/u with Dr. Laural Golden for colon cancer screening, referral given.  Final Clinical Impression(s) / ED Diagnoses Final diagnoses:  Constipation, unspecified constipation type  Grade I hemorrhoids    Rx / DC Orders ED Discharge Orders         Ordered    Lidocaine-Hydrocortisone Ace 1-1 % CREA  2 times daily     04/06/19 0829    polyethylene glycol (MIRALAX / GLYCOLAX) 17 g packet  Daily     04/06/19  [place], Dyshawn Cangelosi, PA-C 04/06/19 [id number]    Evalee Jefferson, PA-C 04/06/19 0900    Hayden Rasmussen, MD 04/06/19 Lurena Nida

## 2019-04-18 ENCOUNTER — Encounter: Payer: Self-pay | Admitting: Nurse Practitioner

## 2019-04-18 ENCOUNTER — Encounter: Payer: Self-pay | Admitting: *Deleted

## 2019-04-18 ENCOUNTER — Ambulatory Visit (INDEPENDENT_AMBULATORY_CARE_PROVIDER_SITE_OTHER): Payer: BC Managed Care – PPO | Admitting: Nurse Practitioner

## 2019-04-18 ENCOUNTER — Other Ambulatory Visit: Payer: Self-pay

## 2019-04-18 DIAGNOSIS — K5900 Constipation, unspecified: Secondary | ICD-10-CM | POA: Diagnosis not present

## 2019-04-18 DIAGNOSIS — K649 Unspecified hemorrhoids: Secondary | ICD-10-CM

## 2019-04-18 DIAGNOSIS — K625 Hemorrhage of anus and rectum: Secondary | ICD-10-CM | POA: Diagnosis not present

## 2019-04-18 MED ORDER — CLENPIQ 10-3.5-12 MG-GM -GM/160ML PO SOLN: 1.0000 | Freq: Once | ORAL | 0 refills | Status: AC

## 2019-04-18 NOTE — Assessment & Plan Note (Signed)
The patient describes constipation with significant straining that is somewhat improved on MiraLAX.  However, he does still have some straining.  Associated hemorrhoids and rectal bleeding.  Further management of these as per below.  He has tried to increase his water intake.  I have recommended he continue to increase his water intake, add Colace 100 mg once daily to help with constipation.  Call for any worsening problems and follow-up in 4 months.

## 2019-04-18 NOTE — Progress Notes (Signed)
Primary Care Physician:  Celene Squibb, MD Primary Gastroenterologist:  Dr. Gala Romney  Chief Complaint  Patient presents with  . Rectal Bleeding  . Hemorrhoids    HPI:   Alex Mullen is a 34 y.o. male who presents on referral from primary care 04/04/2019 for rectal bleeding and possible hemorrhoids.  Reviewed information provided with referral including ER visit dated 04/06/2019.  At that time the patient presented with rectal bleeding including bright red blood after wiping for the past 1 to 2 months as well as rectal irritation and itching.  Query hemorrhoid due to strenuous job and heavy lifting.  Increasing constipation and straining with bowel movements.  Was prescribed rectal suppositories by primary care that was not covered by his insurance so he declined to pick it up.  No other GI complaints.  Noted small external hemorrhoid at 6 o'clock position, no internal hemorrhoids appreciated.  Heme positive on exam.  Topical lidocaine/hydrocortisone was discussed as well as warm soaks and MiraLAX to help avoid constipation and straining.  At the end of his visit he noted his mother said his birth father died when he was for possibly from colon cancer.  Recommended GI evaluation.  PCP office note dated 03/31/2019 at which point the patient requested a GI referral due to hemorrhoids and blood when he wipes.  He was subsequently referred to GI.  Other than heme stool, no other recent labs in our system.  No history of colonoscopy or endoscopy in our system.  Today he states he is doing okay overall.  He states he previously did a lot of weightlifting (especially in HS) and does heavy lifting at work and feels this is catching up with him.  He feels he has hemorrhoids which is causing bleeding. He has rectal bleeding after bowel movements, essentially after every bowel movement. Blood on the paper and on the stool; mucousy blood. He is not constipated since starting MiraLAX and now has a bowel  movement every other day, some straining (though improved). OTC suppositories have helped. Improved abdominal pain since MiraLAX. Denies N/V, melena, fever, chills, unintentional weight loss. His mother indicated his birth father potentially had colon cancer about his age (was admitted for ruptured hemorrhoid).  Denies URI or flu-like symptoms. Denies loss of sense of taste or smell. He has been tested for COVID several months ago and was negative. Not a vaccine candidate at this point. Denies chest pain, dyspnea, dizziness, lightheadedness, syncope, near syncope. Denies any other upper or lower GI symptoms.  Still with hemorrhoid symptoms (itching, irritation, discomfort). He is using hydrocortisone/lidocaine about twice a day, which helps.  Past Medical History:  Diagnosis Date  . Chronic back pain   . Hemorrhoid   . 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 Sciatica of right side   . Shingles     Past Surgical History:  Procedure Laterality Date  . FACIAL FRACTURE SURGERY  2006    Current Outpatient Medications  Medication Sig Dispense Refill  . amphetamine-dextroamphetamine (ADDERALL) 15 MG tablet Take 1 tablet by mouth 3 (three) times daily.    . Lidocaine-Hydrocortisone Ace 1-1 % CREA Apply 1 application topically in the morning and at bedtime. (Patient taking differently: Apply 1 application topically as needed. ) 30 g 0  . polyethylene glycol (MIRALAX / GLYCOLAX) 17 g packet Take 17 g by mouth daily. 14 each 0   No current facility-administered medications for this visit.    Allergies as of 04/18/2019 - Review Complete 04/18/2019  Allergen  Reaction Noted  . Penicillins Other (See Comments) 11/21/2011  . Doxycycline Rash 03/17/2012  . Hydrocodone Rash 11/21/2011  . Robaxin [methocarbamol] Rash 06/22/2012    Family History  Problem Relation Age of Onset  . Colon cancer Father 79       potentially    Social History   Socioeconomic History  . Marital status: Single    Spouse name:  Not on file  . Number of children: Not on file  . Years of education: Not on file  . Highest education level: Not on file  Occupational History  . Not on file  Tobacco Use  . Smoking status: Current Every Day Smoker    Packs/day: 0.50    Years: 10.00    Pack years: 5.00    Types: Cigarettes  . Smokeless tobacco: Former Systems developer    Types: Snuff  Substance and Sexual Activity  . Alcohol use: No  . Drug use: No  . Sexual activity: Yes  Other Topics Concern  . Not on file  Social History Narrative  . Not on file   Social Determinants of Health   Financial Resource Strain:   . Difficulty of Paying Living Expenses:   Food Insecurity:   . Worried About Charity fundraiser in the Last Year:   . Arboriculturist in the Last Year:   Transportation Needs:   . Film/video editor (Medical):   Marland Kitchen Lack of Transportation (Non-Medical):   Physical Activity:   . Days of Exercise per Week:   . Minutes of Exercise per Session:   Stress:   . Feeling of Stress :   Social Connections:   . Frequency of Communication with Friends and Family:   . Frequency of Social Gatherings with Friends and Family:   . Attends Religious Services:   . Active Member of Clubs or Organizations:   . Attends Archivist Meetings:   Marland Kitchen Marital Status:   Intimate Partner Violence:   . Fear of Current or Ex-Partner:   . Emotionally Abused:   Marland Kitchen Physically Abused:   . Sexually Abused:     Review of Systems: General: Negative for anorexia, weight loss, fever, chills, fatigue, weakness. ENT: Negative for hoarseness, difficulty swallowing. CV: Negative for chest pain, angina, palpitations, peripheral edema.  Respiratory: Negative for dyspnea at rest, cough, sputum, wheezing.  GI: See history of present illness.  MS: History of chronic neck pain.  Derm: Negative for rash or itching.  Endo: Negative for unusual weight change.  Heme: Negative for bruising or bleeding. Allergy: Negative for rash or  hives.    Physical Exam: BP 137/84   Pulse 96   Temp (!) 97.3 F (36.3 C) (Temporal)   Ht 5\' 9"  (1.753 m)   Wt 196 lb 9.6 oz (89.2 kg)   BMI 29.03 kg/m  General:   Alert and oriented. Pleasant and cooperative. Well-nourished and well-developed.  Head:  Normocephalic and atraumatic. Eyes:  Without icterus, sclera clear and conjunctiva pink.  Ears:  Normal auditory acuity. Cardiovascular:  S1, S2 present without murmurs appreciated. Extremities without clubbing or edema. Respiratory:  Clear to auscultation bilaterally. No wheezes, rales, or rhonchi. No distress.  Gastrointestinal:  +BS, soft, non-tender and non-distended. No HSM noted. No guarding or rebound. No masses appreciated.  Rectal:  Deferred  Musculoskalatal:  Symmetrical without gross deformities. Neurologic:  Alert and oriented x4;  grossly normal neurologically. Psych:  Alert and cooperative. Normal mood and affect. Heme/Lymph/Immune: No excessive bruising noted.  04/18/2019 9:25 AM   Disclaimer: This note was dictated with voice recognition software. Similar sounding words can inadvertently be transcribed and may not be corrected upon review.

## 2019-04-18 NOTE — Patient Instructions (Signed)
Your health issues we discussed today were:   Constipation: 1. As we discussed, constipation and straining can worsen hemorrhoids and rectal bleeding 2. Continue to increase your water intake 3. Add Colace stool softener (available over-the-counter) 100 mg once a day 4. Continue MiraLAX 5. You can back off on the Colace or the MiraLAX if you begin having diarrhea 6. Call us if you have any worsening or severe problems  Rectal bleeding with known hemorrhoids: 1. Continue using the topical cream (hydrocortisone and lidocaine) as needed for hemorrhoid symptoms 2. As we discussed, your rectal bleeding is likely at least in part due to your hemorrhoids.  However, we will proceed with a colonoscopy to further evaluate for other possible causes, especially given your family history 3. Call us if you have any worsening or severe bleeding  4. Further recommendations will follow your colonoscopy.  Overall I recommend:  1. Continue your other current medications 2. Return for follow-up in 4 months 3. Call us if you have any questions or concerns   ---------------------------------------------------------------  COVID-19 Vaccine Information can be found at: [email or web address].[email or web address] For questions related to vaccine distribution or appointments, please email [email or web address] or call [phone].   ---------------------------------------------------------------   At [facility] we value your feedback. You may receive a survey about your visit today. Please share your experience as we strive to create trusting relationships with our patients to provide genuine, compassionate, quality care.  We appreciate your understanding and patience as we review any laboratory studies, imaging, and other diagnostic tests that are ordered as we care for you. Our office policy is 5 business days for review of these results, and any emergent  or urgent results are addressed in a timely manner for your best interest. If you do not hear from our office in 1 week, please contact us.   We also encourage the use of MyChart, which contains your medical information for your review as well. If you are not enrolled in this feature, an access code is on this after visit summary for your convenience. Thank you for allowing [place] to be involved in your care.  It was great to see you today!  I hope you have a great day!!

## 2019-04-18 NOTE — Assessment & Plan Note (Addendum)
Documented external hemorrhoid on rectal exam in the emergency department.  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  This is likely causing straining, in addition to the straining from his constipation.  Hemorrhoids are a likely contributor to rectal bleeding.  However, given his family history (as per below) we will proceed with colonoscopy to further evaluate.  Recommend he continue his hydrocortisone/lidocaine topical therapy which has made some improvement to him.  Continue MiraLAX as well.  Add Colace as per below.  Follow-up in 4 months.

## 2019-04-18 NOTE — Assessment & Plan Note (Signed)
The patient describes rectal bleeding associated with constipation/straining and likely hemorrhoids.  However, his mother informed him that his birth father passed away from colon cancer about his age (about 34 years old).  The emergency room physician did find hemorrhoids on rectal exam as well as heme positive stool.  At this point, given his history and family history we will proceed with a colonoscopy to further evaluate.  Hemorrhoid and constipation management as per above.  Return for follow-up in 4 months.  Proceed with TCS on propofol/MAC with Dr. Gala Romney in near future: the risks, benefits, and alternatives have been discussed with the patient in detail. The patient states understanding and desires to proceed.  The patient is currently on Adderall.  Previous chronic pain medication due to chronic neck pain, although he is not currently on this.The patient is not on any other anticoagulants, anxiolytics, chronic pain medications, antidepressants, antidiabetics, or iron supplements.  Denies alcohol and drug use.  We will plan for the procedure on propofol/MAC to promote adequate sedation.

## 2019-05-25 ENCOUNTER — Other Ambulatory Visit: Payer: Medicaid Other

## 2019-05-30 ENCOUNTER — Ambulatory Visit
Admission: EM | Admit: 2019-05-30 | Discharge: 2019-05-30 | Disposition: A | Discharge status: Home / Self Care | Payer: BC Managed Care – PPO | Attending: Emergency Medicine | Admitting: Emergency Medicine

## 2019-05-30 ENCOUNTER — Encounter: Payer: Self-pay | Admitting: Emergency Medicine

## 2019-05-30 ENCOUNTER — Other Ambulatory Visit: Payer: Self-pay

## 2019-05-30 DIAGNOSIS — Z1152 Encounter for screening for COVID-19: Secondary | ICD-10-CM | POA: Diagnosis not present

## 2019-05-30 MED ORDER — FLUTICASONE PROPIONATE 50 MCG/ACT NA SUSP: 1.0000 | Freq: Every day | NASAL | 0 refills | Status: AC

## 2019-05-30 MED ORDER — BENZONATATE 100 MG PO CAPS: 100.0000 mg | ORAL_CAPSULE | Freq: Three times a day (TID) | ORAL | 0 refills | Status: DC

## 2019-05-30 MED ORDER — PREDNISONE 10 MG PO TABS: 20.0000 mg | ORAL_TABLET | Freq: Every day | ORAL | 0 refills | Status: DC

## 2019-05-30 NOTE — ED Provider Notes (Signed)
[facility]    CSN: [id number] Arrival date & time: 05/30/19  1841      History   Chief Complaint Chief Complaint  Patient presents with  . Headache    HPI Brendan Lohan is a 34 y.o. male.   Who presented to the urgent care with a complaint of cough congestion and headache for the past 9 days.  Denies sick exposure to COVID, flu or strep.  Denies recent travel.  Denies aggravating or alleviating symptoms.  Denies previous COVID infection.   Denies fever, chills, fatigue, rhinorrhea, sore throat, SOB, wheezing, chest pain, nausea, vomiting, changes in bowel or bladder habits.    The history is provided by the patient. No language interpreter was used.  Headache Associated symptoms: congestion, cough and fatigue     Past Medical History:  Diagnosis Date  . Chronic back pain   . Hemorrhoid   . 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 Sciatica of right side   . Shingles     Patient Active Problem List   Diagnosis Date Noted  . Rectal bleeding 04/18/2019  . Constipation 04/18/2019  . Hemorrhoids 04/18/2019  . Neck pain 03/15/2015  . Tobacco abuse 03/18/2012  . 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 Herpes zoster keratitis 03/17/2012  . Toe fracture, left 03/17/2012    Past Surgical History:  Procedure Laterality Date  . FACIAL FRACTURE SURGERY  2006       Home Medications    Prior to Admission medications   Medication Sig Start Date End Date Taking? Authorizing Provider  ALPRAZolam Duanne Moron) 0.5 MG tablet Take 0.5 mg by mouth every 8 (eight) hours as needed. 04/27/19   [provider]  amphetamine-dextroamphetamine (ADDERALL) 15 MG tablet Take 1 tablet by mouth 3 (three) times daily. 04/07/19   [provider]  benzonatate (TESSALON) 100 MG capsule Take 1 capsule (100 mg total) by mouth every 8 (eight) hours. 05/30/19   Korby Ratay, Darrelyn Hillock, FNP  fluticasone (FLONASE) 50 MCG/ACT nasal spray Place 1 spray into both nostrils daily for 14  days. 05/30/19 06/13/19  Jennalee Greaves, Darrelyn Hillock, FNP  Lidocaine-Hydrocortisone Ace 1-1 % CREA Apply 1 application topically in the morning and at bedtime. Patient taking differently: Apply 1 application topically as needed.  04/06/19   Evalee Jefferson, PA-C  metoprolol succinate (TOPROL-XL) 25 MG 24 hr tablet Take 25 mg by mouth daily. 01/24/19   [provider]  polyethylene glycol (MIRALAX / GLYCOLAX) 17 g packet Take 17 g by mouth daily. 04/06/19   Evalee Jefferson, PA-C  predniSONE (DELTASONE) 10 MG tablet Take 2 tablets (20 mg total) by mouth daily. 05/30/19   Nazaiah Navarrete, Darrelyn Hillock, FNP    Family History Family History  Problem Relation Age of Onset  . Colon cancer Father 83       potentially    Social History Social History   Tobacco Use  . Smoking status: Current Every Day Smoker    Packs/day: 0.50    Years: 10.00    Pack years: 5.00    Types: Cigarettes  . Smokeless tobacco: Former Systems developer    Types: Snuff  Substance Use Topics  . Alcohol use: No  . Drug use: No     Allergies   Penicillins, Doxycycline, Hydrocodone, and Robaxin [methocarbamol]   Review of Systems Review of Systems  Constitutional: Positive for fatigue.  HENT: Positive for congestion.   Respiratory: Positive for cough and wheezing.   Cardiovascular: Negative.   Gastrointestinal: Negative.   Neurological: Positive for  headaches.  All other systems reviewed and are negative.    Physical Exam Triage Vital Signs ED Triage Vitals  Enc Vitals Group     BP      Pulse      Resp      Temp      Temp src      SpO2      Weight      Height      Head Circumference      Peak Flow      Pain Score      Pain Loc      Pain Edu?      Excl. in [place]?    No data found.  Updated Vital Signs BP 133/77 (BP Location: Right Arm)   Pulse 96   Temp 98.6 F (37 C) (Oral)   Resp 18   SpO2 95%   Visual Acuity Right Eye Distance:   Left Eye Distance:   Bilateral Distance:    Right Eye Near:   Left Eye Near:      Bilateral Near:     Physical Exam Vitals and nursing note reviewed.  Constitutional:      General: He is not in acute distress.    Appearance: Normal appearance. He is normal weight. He is not ill-appearing or toxic-appearing.  HENT:     Head: Normocephalic.     Right Ear: Tympanic membrane, ear canal and external ear normal. There is no impacted cerumen.     Left Ear: Tympanic membrane, ear canal and external ear normal. There is no impacted cerumen.     Nose: Nose normal. No congestion.     Mouth/Throat:     Mouth: Mucous membranes are moist.     Pharynx: Oropharynx is clear. No oropharyngeal exudate or posterior oropharyngeal erythema.  Cardiovascular:     Rate and Rhythm: Normal rate and regular rhythm.     Pulses: Normal pulses.     Heart sounds: Normal heart sounds. No murmur.  Pulmonary:     Effort: Pulmonary effort is normal. No respiratory distress.     Breath sounds: Normal breath sounds. No wheezing or rhonchi.  Chest:     Chest wall: No tenderness.  Neurological:     Mental Status: He is alert and oriented to person, place, and time.      UC Treatments / Results  Labs (all labs ordered are listed, but only abnormal results are displayed) Labs Reviewed  NOVEL CORONAVIRUS, NAA    EKG   Radiology No results found.  Procedures Procedures (including critical care time)  Medications Ordered in UC Medications - No data to display  Initial Impression / Assessment and Plan / UC Course  I have reviewed the triage vital signs and the nursing notes.  Pertinent labs & imaging results that were available during my care of the patient were reviewed by me and considered in my medical decision making (see chart for details).      Final Clinical Impressions(s) / UC Diagnoses   Final diagnoses:  Encounter for screening for COVID-19     Discharge Instructions     COVID testing ordered.  It will take between 2-7 days for test results.  Someone will contact  you regarding abnormal results.    In the meantime: You should remain isolated in your home for 10 days from symptom onset AND greater than 24 hours after symptoms resolution (absence of fever without the use of fever-reducing medication and improvement in respiratory symptoms), whichever  is longer Get plenty of rest and push fluids Use medications daily for symptom relief Use OTC medications like ibuprofen or tylenol as needed fever or pain Call or go to the ED if you have any new or worsening symptoms such as fever, worsening cough, shortness of breath, chest tightness, chest pain, turning blue, changes in mental status, etc...     ED Prescriptions    Medication Sig Dispense Auth. Provider   benzonatate (TESSALON) 100 MG capsule Take 1 capsule (100 mg total) by mouth every 8 (eight) hours. 21 capsule Soledad Budreau S, FNP   predniSONE (DELTASONE) 10 MG tablet Take 2 tablets (20 mg total) by mouth daily. 15 tablet Zacarias Krauter S, FNP   fluticasone (FLONASE) 50 MCG/ACT nasal spray Place 1 spray into both nostrils daily for 14 days. 16 g Emerson Monte, FNP     PDMP not reviewed this encounter.   Emerson Monte, FNP 05/30/19 1904

## 2019-05-30 NOTE — Discharge Instructions (Signed)

## 2019-05-30 NOTE — ED Triage Notes (Signed)
Started feeling bad 05/21/2019.  Reports he has had fever ?101.  Complains of no energy, complains of headache, cough, head congestion

## 2019-05-31 LAB — NOVEL CORONAVIRUS, NAA: SARS-CoV-2, NAA: NOT DETECTED

## 2019-05-31 LAB — SARS-COV-2, NAA 2 DAY TAT

## 2019-07-01 ENCOUNTER — Encounter (HOSPITAL_COMMUNITY)
Admission: RE | Admit: 2019-07-01 | Discharge: 2019-07-01 | Disposition: A | Discharge status: Home / Self Care | Payer: BC Managed Care – PPO | Source: Ambulatory Visit | Attending: Internal Medicine | Admitting: Internal Medicine

## 2019-07-01 ENCOUNTER — Other Ambulatory Visit: Payer: Self-pay

## 2019-07-01 ENCOUNTER — Other Ambulatory Visit (HOSPITAL_COMMUNITY)
Admission: RE | Admit: 2019-07-01 | Discharge: 2019-07-01 | Disposition: A | Discharge status: Home / Self Care | Payer: BC Managed Care – PPO | Source: Ambulatory Visit | Attending: Internal Medicine | Admitting: Internal Medicine

## 2019-07-01 DIAGNOSIS — Z01812 Encounter for preprocedural laboratory examination: Secondary | ICD-10-CM | POA: Insufficient documentation

## 2019-07-01 DIAGNOSIS — Z20822 Contact with and (suspected) exposure to covid-19: Secondary | ICD-10-CM | POA: Diagnosis not present

## 2019-07-01 LAB — SARS CORONAVIRUS 2 (TAT 6-24 HRS): SARS Coronavirus 2: NEGATIVE

## 2019-07-01 NOTE — Patient Instructions (Signed)
Hristopher Missildine  07/01/2019     @PREFPERIOPPHARMACY @   Your procedure is scheduled on  07/04/2019   Report to [facility] at  79  A.M.  Call this number if you have problems the morning of surgery:  [phone]   Remember:  Follow the diet and prep instructions given to you by Dr Roseanne Kaufman office.                    Take these medicines the morning of surgery with A SIP OF WATER  Xanax(if needed), adderall, metoprolol.    Do not wear jewelry, make-up or nail polish.  Do not wear lotions, powders, or perfumes. Please wear deodorant and brush your teeth.  Do not shave 48 hours prior to surgery.  Men may shave face and neck.  Do not bring valuables to the hospital.  [facility] is not responsible for any belongings or valuables.  Contacts, dentures or bridgework may not be worn into surgery.  Leave your suitcase in the car.  After surgery it may be brought to your room.  For patients admitted to the hospital, discharge time will be determined by your treatment team.  Patients discharged the day of surgery will not be allowed to drive home.   Name and phone number of your driver:   family Special instructions:  DO NOT smoke the morning of your procedure.  Please read over the following fact sheets that you were given. Anesthesia Post-op Instructions and Care and Recovery After Surgery       Colonoscopy, Adult, Care After This sheet gives you information about how to care for yourself after your procedure. Your health care provider may also give you more specific instructions. If you have problems or questions, contact your health care provider. What can I expect after the procedure? After the procedure, it is common to have:  A small amount of blood in your stool for 24 hours after the procedure.  Some gas.  Mild cramping or bloating of your abdomen. Follow these instructions at home: Eating and drinking   Drink enough fluid to keep your urine pale  yellow.  Follow instructions from your health care provider about eating or drinking restrictions.  Resume your normal diet as instructed by your health care provider. Avoid heavy or fried foods that are hard to digest. Activity  Rest as told by your health care provider.  Avoid sitting for a long time without moving. Get up to take short walks every 1-2 hours. This is important to improve blood flow and breathing. Ask for help if you feel weak or unsteady.  Return to your normal activities as told by your health care provider. Ask your health care provider what activities are safe for you. Managing cramping and bloating   Try walking around when you have cramps or feel bloated.  Apply heat to your abdomen as told by your health care provider. Use the heat source that your health care provider recommends, such as a moist heat pack or a heating pad. ? Place a towel between your skin and the heat source. ? Leave the heat on for 20-30 minutes. ? Remove the heat if your skin turns bright red. This is especially important if you are unable to feel pain, heat, or cold. You may have a greater risk of getting burned. General instructions  For the first 24 hours after the procedure: ? Do not drive or use machinery. ? Do not sign  important documents. ? Do not drink alcohol. ? Do your regular daily activities at a slower pace than normal. ? Eat soft foods that are easy to digest.  Take over-the-counter and prescription medicines only as told by your health care provider.  Keep all follow-up visits as told by your health care provider. This is important. Contact a health care provider if:  You have blood in your stool 2-3 days after the procedure. Get help right away if you have:  More than a small spotting of blood in your stool.  Large blood clots in your stool.  Swelling of your abdomen.  Nausea or vomiting.  A fever.  Increasing pain in your abdomen that is not relieved with  medicine. Summary  After the procedure, it is common to have a small amount of blood in your stool. You may also have mild cramping and bloating of your abdomen.  For the first 24 hours after the procedure, do not drive or use machinery, sign important documents, or drink alcohol.  Get help right away if you have a lot of blood in your stool, nausea or vomiting, a fever, or increased pain in your abdomen. This information is not intended to replace advice given to you by your health care provider. Make sure you discuss any questions you have with your health care provider. Document Revised: 08/09/2018 Document Reviewed: 08/09/2018 Elsevier Patient Education  [place] These instructions provide you with information about caring for yourself after your procedure. Your health care provider may also give you more specific instructions. Your treatment has been planned according to current medical practices, but problems sometimes occur. Call your health care provider if you have any problems or questions after your procedure. What can I expect after the procedure? After your procedure, you may:  Feel sleepy for several hours.  Feel clumsy and have poor balance for several hours.  Feel forgetful about what happened after the procedure.  Have poor judgment for several hours.  Feel nauseous or vomit.  Have a sore throat if you had a breathing tube during the procedure. Follow these instructions at home: For at least 24 hours after the procedure:      Have a responsible adult stay with you. It is important to have someone help care for you until you are awake and alert.  Rest as needed.  Do not: ? Participate in activities in which you could fall or become injured. ? Drive. ? Use heavy machinery. ? Drink alcohol. ? Take sleeping pills or medicines that cause drowsiness. ? Make important decisions or sign legal documents. ? Take care  of children on your own. Eating and drinking  Follow the diet that is recommended by your health care provider.  If you vomit, drink water, juice, or soup when you can drink without vomiting.  Make sure you have little or no nausea before eating solid foods. General instructions  Take over-the-counter and prescription medicines only as told by your health care provider.  If you have sleep apnea, surgery and certain medicines can increase your risk for breathing problems. Follow instructions from your health care provider about wearing your sleep device: ? Anytime you are sleeping, including during daytime naps. ? While taking prescription pain medicines, sleeping medicines, or medicines that make you drowsy.  If you smoke, do not smoke without supervision.  Keep all follow-up visits as told by your health care provider. This is important. Contact a health care provider if:  You keep feeling nauseous or you keep vomiting.  You feel light-headed.  You develop a rash.  You have a fever. Get help right away if:  You have trouble breathing. Summary  For several hours after your procedure, you may feel sleepy and have poor judgment.  Have a responsible adult stay with you for at least 24 hours or until you are awake and alert. This information is not intended to replace advice given to you by your health care provider. Make sure you discuss any questions you have with your health care provider. Document Revised: 04/13/2017 Document Reviewed: 05/06/2015 Elsevier Patient Education  [place].

## 2019-07-04 ENCOUNTER — Encounter: Payer: Self-pay | Admitting: Internal Medicine

## 2019-07-04 ENCOUNTER — Ambulatory Visit (HOSPITAL_COMMUNITY): Payer: BC Managed Care – PPO | Admitting: Anesthesiology

## 2019-07-04 ENCOUNTER — Encounter (HOSPITAL_COMMUNITY): Payer: Self-pay | Admitting: Internal Medicine

## 2019-07-04 ENCOUNTER — Ambulatory Visit (HOSPITAL_COMMUNITY)
Admission: RE | Admit: 2019-07-04 | Discharge: 2019-07-04 | Disposition: A | Discharge status: Home / Self Care | Payer: BC Managed Care – PPO | Attending: Internal Medicine | Admitting: Internal Medicine

## 2019-07-04 ENCOUNTER — Other Ambulatory Visit: Payer: Self-pay

## 2019-07-04 ENCOUNTER — Encounter (HOSPITAL_COMMUNITY)
Admission: RE | Disposition: A | Discharge status: Home / Self Care | Payer: Self-pay | Source: Home / Self Care | Attending: Internal Medicine

## 2019-07-04 DIAGNOSIS — K921 Melena: Secondary | ICD-10-CM | POA: Insufficient documentation

## 2019-07-04 DIAGNOSIS — K640 First degree hemorrhoids: Secondary | ICD-10-CM | POA: Insufficient documentation

## 2019-07-04 DIAGNOSIS — F419 Anxiety disorder, unspecified: Secondary | ICD-10-CM | POA: Diagnosis not present

## 2019-07-04 DIAGNOSIS — Z79891 Long term (current) use of opiate analgesic: Secondary | ICD-10-CM | POA: Diagnosis not present

## 2019-07-04 DIAGNOSIS — F909 Attention-deficit hyperactivity disorder, unspecified type: Secondary | ICD-10-CM | POA: Insufficient documentation

## 2019-07-04 DIAGNOSIS — F17210 Nicotine dependence, cigarettes, uncomplicated: Secondary | ICD-10-CM | POA: Diagnosis not present

## 2019-07-04 DIAGNOSIS — Z79899 Other long term (current) drug therapy: Secondary | ICD-10-CM | POA: Insufficient documentation

## 2019-07-04 HISTORY — PX: COLONOSCOPY WITH PROPOFOL: SHX5780

## 2019-07-04 SURGERY — COLONOSCOPY WITH PROPOFOL
Anesthesia: General

## 2019-07-04 MED ORDER — PROPOFOL 500 MG/50ML IV EMUL
INTRAVENOUS | Status: DC | PRN
  Administered 2019-07-04: 200 ug/kg/min via INTRAVENOUS
  Administered 2019-07-04: 150 ug/kg/min via INTRAVENOUS

## 2019-07-04 MED ORDER — LACTATED RINGERS IV SOLN: Freq: Once | INTRAVENOUS | Status: AC

## 2019-07-04 MED ORDER — LIDOCAINE HCL (CARDIAC) PF 50 MG/5ML IV SOSY
PREFILLED_SYRINGE | INTRAVENOUS | Status: DC | PRN
  Administered 2019-07-04: 100 mg via INTRAVENOUS

## 2019-07-04 MED ORDER — PROPOFOL 10 MG/ML IV BOLUS
INTRAVENOUS | Status: DC | PRN
  Administered 2019-07-04: 60 mg via INTRAVENOUS

## 2019-07-04 MED ORDER — LACTATED RINGERS IV SOLN: INTRAVENOUS | Status: DC | PRN

## 2019-07-04 MED ORDER — CHLORHEXIDINE GLUCONATE CLOTH 2 % EX PADS: 6.0000 | MEDICATED_PAD | Freq: Once | CUTANEOUS | Status: DC

## 2019-07-04 MED ORDER — KETAMINE HCL 10 MG/ML IJ SOLN
INTRAMUSCULAR | Status: DC | PRN
  Administered 2019-07-04: 20 mg via INTRAVENOUS

## 2019-07-04 MED ORDER — KETAMINE HCL 50 MG/5ML IJ SOSY
PREFILLED_SYRINGE | INTRAMUSCULAR | Status: AC
  Filled 2019-07-04: qty 5

## 2019-07-04 MED ORDER — PROPOFOL 10 MG/ML IV BOLUS
INTRAVENOUS | Status: AC
  Filled 2019-07-04: qty 60

## 2019-07-04 MED ORDER — PROPOFOL 10 MG/ML IV BOLUS
INTRAVENOUS | Status: AC
  Filled 2019-07-04: qty 20

## 2019-07-04 NOTE — OR Nursing (Signed)
Alex Mullen was unable to work June 6, 7, 2021 due to a procedure at [facility].  He will be able to return to work June 8th after 2:00 PM

## 2019-07-04 NOTE — H&P (Signed)
@LOGO @   Primary Care Physician:  Celene Squibb, MD Primary Gastroenterologist:  Dr. Gala Romney Pre-Procedure History & Physical: HPI:  Alex Mullen is a 34 y.o. male here for here for further evaluation of rectal bleeding via colonoscopy.  He is taking MiraLAX with fairly good management of constipation continues to see small amounts of blood per rectum.  He confirms his birth father may well of had colon cancer diagnosed around age 33.  Past Medical History:  Diagnosis Date  . Chronic back pain   . Hemorrhoid   . 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 Sciatica of right side   . Shingles     Past Surgical History:  Procedure Laterality Date  . FACIAL FRACTURE SURGERY  2006    Prior to Admission medications   Medication Sig Start Date End Date Taking? Authorizing Provider  ALPRAZolam Duanne Moron) 0.5 MG tablet Take 0.5 mg by mouth 3 (three) times daily.  04/27/19  Yes [provider]  amphetamine-dextroamphetamine (ADDERALL) 15 MG tablet Take 15 mg by mouth 3 (three) times daily.  04/07/19  Yes [provider]  buprenorphine (SUBUTEX) 8 MG SUBL SL tablet Place 8 mg under the tongue in the morning, at noon, and at bedtime. 06/21/19  Yes [provider]  Lidocaine-Hydrocortisone Ace 1-1 % CREA Apply 1 application topically in the morning and at bedtime. Patient taking differently: Apply 1 application topically daily as needed.  04/06/19  Yes Idol, Almyra Free, PA-C  metoprolol succinate (TOPROL-XL) 25 MG 24 hr tablet Take 25 mg by mouth daily. 01/24/19  Yes [provider]  benzonatate (TESSALON) 100 MG capsule Take 1 capsule (100 mg total) by mouth every 8 (eight) hours. Patient not taking: Reported on 06/29/2019 05/30/19   Emerson Monte, FNP  fluticasone (FLONASE) 50 MCG/ACT nasal spray Place 1 spray into both nostrils daily for 14 days. 05/30/19 06/13/19  Avegno, Darrelyn Hillock, FNP  polyethylene glycol (MIRALAX / GLYCOLAX) 17 g packet Take 17 g by mouth daily. Patient not  taking: Reported on 06/29/2019 04/06/19   Evalee Jefferson, PA-C  predniSONE (DELTASONE) 10 MG tablet Take 2 tablets (20 mg total) by mouth daily. Patient not taking: Reported on 06/29/2019 05/30/19   Emerson Monte, FNP    Allergies as of 04/18/2019 - Review Complete 04/18/2019  Allergen Reaction Noted  . Penicillins Other (See Comments) 11/21/2011  . Doxycycline Rash 03/17/2012  . Hydrocodone Rash 11/21/2011  . Robaxin [methocarbamol] Rash 06/22/2012    Family History  Problem Relation Age of Onset  . Colon cancer Father 62       potentially    Social History   Socioeconomic History  . Marital status: Single    Spouse name: Not on file  . Number of children: Not on file  . Years of education: Not on file  . Highest education level: Not on file  Occupational History  . Not on file  Tobacco Use  . Smoking status: Current Every Day Smoker    Packs/day: 0.50    Years: 10.00    Pack years: 5.00    Types: Cigarettes  . Smokeless tobacco: Former Systems developer    Types: Snuff  Substance and Sexual Activity  . Alcohol use: No  . Drug use: No  . Sexual activity: Yes  Other Topics Concern  . Not on file  Social History Narrative  . Not on file   Social Determinants of Health   Financial Resource Strain:   . Difficulty of Paying Living Expenses:   Food  Insecurity:   . Worried About Charity fundraiser in the Last Year:   . Arboriculturist in the Last Year:   Transportation Needs:   . Film/video editor (Medical):   Marland Kitchen Lack of Transportation (Non-Medical):   Physical Activity:   . Days of Exercise per Week:   . Minutes of Exercise per Session:   Stress:   . Feeling of Stress :   Social Connections:   . Frequency of Communication with Friends and Family:   . Frequency of Social Gatherings with Friends and Family:   . Attends Religious Services:   . Active Member of Clubs or Organizations:   . Attends Archivist Meetings:   Marland Kitchen Marital Status:   Intimate Partner  Violence:   . Fear of Current or Ex-Partner:   . Emotionally Abused:   Marland Kitchen Physically Abused:   . Sexually Abused:     Review of Systems: See HPI, otherwise negative ROS  Physical Exam: BP 116/65   Pulse 84   Resp 10   SpO2 94%  General:   Alert,  Well-developed, well-nourished, pleasant and cooperative in NAD Neck:  Supple; no masses or thyromegaly. No significant cervical adenopathy. Lungs:  Clear throughout to auscultation.   No wheezes, crackles, or rhonchi. No acute distress. Heart:  Regular rate and rhythm; no murmurs, clicks, rubs,  or gallops. Abdomen: Non-distended, normal bowel sounds.  Soft and nontender without appreciable mass or hepatosplenomegaly.  Pulses:  Normal pulses noted. Extremities:  Without clubbing or edema.  Impression/Plan: 34 year old gentleman with intermittent rectal bleeding.  Positive positive family history of colon cancer in first-degree relative at a young age.  He is here for diagnostic colonoscopy.  The risks, benefits, limitations, alternatives and imponderables have been reviewed with the patient. Questions have been answered. All parties are agreeable.      Notice: This dictation was prepared with Dragon dictation along with smaller phrase technology. Any transcriptional errors that result from this process are unintentional and may not be corrected upon review.

## 2019-07-04 NOTE — Anesthesia Preprocedure Evaluation (Signed)
Anesthesia Evaluation  Patient identified by MRN, date of birth, ID band Patient awake    Reviewed: Allergy & Precautions, NPO status , Patient's Chart, lab work & pertinent test results  Airway Mallampati: III  TM Distance: >3 FB Neck ROM: Full    Dental  (+) Dental Advisory Given, Teeth Intact   Pulmonary Current Smoker and Patient abstained from smoking.,    Pulmonary exam normal breath sounds clear to auscultation       Cardiovascular Exercise Tolerance: Good Normal cardiovascular exam Rhythm:Regular Rate:Normal     Neuro/Psych Anxiety  Neuromuscular disease    GI/Hepatic negative GI ROS, Neg liver ROS,   Endo/Other  negative endocrine ROS  Renal/GU negative Renal ROS     Musculoskeletal Chronic back  pain   Abdominal   Peds  (+) ADHD Hematology negative hematology ROS (+)   Anesthesia Other Findings   Reproductive/Obstetrics negative OB ROS                            Anesthesia Physical Anesthesia Plan  ASA: II  Anesthesia Plan: General   Post-op Pain Management:    Induction: Intravenous  PONV Risk Score and Plan: TIVA  Airway Management Planned: Nasal Cannula, Natural Airway and Simple Face Mask  Additional Equipment:   Intra-op Plan:   Post-operative Plan:   Informed Consent: I have reviewed the patients History and Physical, chart, labs and discussed the procedure including the risks, benefits and alternatives for the proposed anesthesia with the patient or authorized representative who has indicated his/her understanding and acceptance.     Dental advisory given  Plan Discussed with: CRNA and Surgeon  Anesthesia Plan Comments:         Anesthesia Quick Evaluation

## 2019-07-04 NOTE — Anesthesia Postprocedure Evaluation (Signed)
Anesthesia Post Note  Patient: Alex Mullen  Procedure(s) Performed: COLONOSCOPY WITH PROPOFOL (N/A )  Patient location during evaluation: PACU Anesthesia Type: General Level of consciousness: awake and alert and patient cooperative Pain management: satisfactory to patient Vital Signs Assessment: post-procedure vital signs reviewed and stable Respiratory status: spontaneous breathing Cardiovascular status: stable Postop Assessment: no apparent nausea or vomiting Anesthetic complications: no     Last Vitals:  Vitals:   07/04/19 1311 07/04/19 1327  BP: 116/61 121/79  Pulse:  81  Resp:  16  SpO2:  97%    Last Pain:  Vitals:   07/04/19 1327  TempSrc:   PainSc: 0-No pain                 Nehemias Sauceda

## 2019-07-04 NOTE — Discharge Instructions (Signed)
Colonoscopy Discharge Instructions  Read the instructions outlined below and refer to this sheet in the next few weeks. These discharge instructions provide you with general information on caring for yourself after you leave the hospital. Your doctor may also give you specific instructions. While your treatment has been planned according to the most current medical practices available, unavoidable complications occasionally occur. If you have any problems or questions after discharge, call Dr. Gala Romney at [phone]. ACTIVITY  You may resume your regular activity, but move at a slower pace for the next 24 hours.   Take frequent rest periods for the next 24 hours.   Walking will help get rid of the air and reduce the bloated feeling in your belly (abdomen).   No driving for 24 hours (because of the medicine (anesthesia) used during the test).    Do not sign any important legal documents or operate any machinery for 24 hours (because of the anesthesia used during the test).  NUTRITION  Drink plenty of fluids.   YDrink plenty of fluidsou may resume your normal diet as instructed by your doctor.   Begin with a light meal and progress to your normal diet. Heavy or fried foods are harder to digest and may make you feel sick to your stomach (nauseated).   Avoid alcoholic beverages for 24 hours or as instructed.  MEDICATIONS  You may resume your normal medications unless your doctor tells you otherwise.  WHAT YOU CAN EXPECT TODAY  Some feelings of bloating in the abdomen.   Passage of more gas than usual.   Spotting of blood in your stool or on the toilet paper.  IF YOU HAD POLYPS REMOVED DURING THE COLONOSCOPY:  No aspirin products for 7 days or as instructed.   No alcohol for 7 days or as instructed.   Eat a soft diet for the next 24 hours.  FINDING OUT THE RESULTS OF YOUR TEST Not all test results are available during your visit. If your test results are not back during the visit,  make an appointment with your caregiver to find out the results. Do not assume everything is normal if you have not heard from your caregiver or the medical facility. It is important for you to follow up on all of your test results.  SEEK IMMEDIATE MEDICAL ATTENTION IF:  You have more than a spotting of blood in your stool.   Your belly is swollen (abdominal distention).   You are nauseated or vomiting.   You have a temperature over 101.   You have abdominal pain or discomfort that is severe or gets worse throughout the day.    Hemorrhoid information provided  No polyps or tumor found  Given your family history, I recommend a repeat colonoscopy in 5 years  Office visit with [place] in 4 to 6 weeks (AB) for possible hemorrhoid banding  Pamphlet on hemorrhoid banding provided to the patient today  At patient's request, I called Beth at [phone] immediately to voicemail.   Hemorrhoids Hemorrhoids are swollen veins in and around the rectum or anus. There are two types of hemorrhoids:  Internal hemorrhoids. These occur in the veins that are just inside the rectum. They may poke through to the outside and become irritated and painful.  External hemorrhoids. These occur in the veins that are outside the anus and can be felt as a painful swelling or hard lump near the anus. Most hemorrhoids do not cause serious problems, and they can be managed with home treatments  such as diet and lifestyle changes. If home treatments do not help the symptoms, procedures can be done to shrink or remove the hemorrhoids. What are the causes? This condition is caused by increased pressure in the anal area. This pressure may result from various things, including:  Constipation.  Straining to have a bowel movement.  Diarrhea.  Pregnancy.  Obesity.  Sitting for long periods of time.  Heavy lifting or other activity that causes you to strain.  Anal sex.  Riding a bike for a long period of  time. What are the signs or symptoms? Symptoms of this condition include:  Pain.  Anal itching or irritation.  Rectal bleeding.  Leakage of stool (feces).  Anal swelling.  One or more lumps around the anus. How is this diagnosed? This condition can often be diagnosed through a visual exam. Other exams or tests may also be done, such as:  An exam that involves feeling the rectal area with a gloved hand (digital rectal exam).  An exam of the anal canal that is done using a small tube (anoscope).  A blood test, if you have lost a significant amount of blood.  A test to look inside the colon using a flexible tube with a camera on the end (sigmoidoscopy or colonoscopy). How is this treated? This condition can usually be treated at home. However, various procedures may be done if dietary changes, lifestyle changes, and other home treatments do not help your symptoms. These procedures can help make the hemorrhoids smaller or remove them completely. Some of these procedures involve surgery, and others do not. Common procedures include:  Rubber band ligation. Rubber bands are placed at the base of the hemorrhoids to cut off their blood supply.  Sclerotherapy. Medicine is injected into the hemorrhoids to shrink them.  Infrared coagulation. A type of light energy is used to get rid of the hemorrhoids.  Hemorrhoidectomy surgery. The hemorrhoids are surgically removed, and the veins that supply them are tied off.  Stapled hemorrhoidopexy surgery. The surgeon staples the base of the hemorrhoid to the rectal wall. Follow these instructions at home: Eating and drinking   Eat foods that have a lot of fiber in them, such as whole grains, beans, nuts, fruits, and vegetables.  Ask your health care provider about taking products that have added fiber (fiber supplements).  Reduce the amount of fat in your diet. You can do this by eating low-fat dairy products, eating less red meat, and  avoiding processed foods.  Drink enough fluid to keep your urine pale yellow. Managing pain and swelling   Take warm sitz baths for 20 minutes, 3-4 times a day to ease pain and discomfort. You may do this in a bathtub or using a portable sitz bath that fits over the toilet.  If directed, apply ice to the affected area. Using ice packs between sitz baths may be helpful. ? Put ice in a plastic bag. ? Place a towel between your skin and the bag. ? Leave the ice on for 20 minutes, 2-3 times a day. General instructions  Take over-the-counter and prescription medicines only as told by your health care provider.  Use medicated creams or suppositories as told.  Get regular exercise. Ask your health care provider how much and what kind of exercise is best for you. In general, you should do moderate exercise for at least 30 minutes on most days of the week (150 minutes each week). This can include activities such as walking, biking, or  yoga.  Go to the bathroom when you have the urge to have a bowel movement. Do not wait.  Avoid straining to have bowel movements.  Keep the anal area dry and clean. Use wet toilet paper or moist towelettes after a bowel movement.  Do not sit on the toilet for long periods of time. This increases blood pooling and pain.  Keep all follow-up visits as told by your health care provider. This is important. Contact a health care provider if you have:  Increasing pain and swelling that are not controlled by treatment or medicine.  Difficulty having a bowel movement, or you are unable to have a bowel movement.  Pain or inflammation outside the area of the hemorrhoids. Get help right away if you have:  Uncontrolled bleeding from your rectum. Summary  Hemorrhoids are swollen veins in and around the rectum or anus.  Most hemorrhoids can be managed with home treatments such as diet and lifestyle changes.  Taking warm sitz baths can help ease pain and  discomfort.  In severe cases, procedures or surgery can be done to shrink or remove the hemorrhoids. This information is not intended to replace advice given to you by your health care provider. Make sure you discuss any questions you have with your health care provider. Document Revised: 06/11/2018 Document Reviewed: 06/04/2017 Elsevier Patient Education  [place].

## 2019-07-04 NOTE — Op Note (Signed)
[facility] Patient Name: Alex Mullen Procedure Date: 07/04/2019 12:33 PM MRN: [id number] Date of Birth: 1986-01-10 Attending MD: Norvel Richards , MD CSN: [id number] Age: 34 Admit Type: Outpatient Procedure:                Colonoscopy Indications:              Hematochezia Providers:                Norvel Richards, MD, Rosina Lowenstein, RN, Aram Candela Referring MD:              Medicines:                Propofol per Anesthesia Complications:            No immediate complications. Estimated Blood Loss:     Estimated blood loss: none. Estimated blood loss:                            none. Procedure:                Pre-Anesthesia Assessment:                           - Prior to the procedure, a History and Physical                            was performed, and patient medications and                            allergies were reviewed. The patient's tolerance of                            previous anesthesia was also reviewed. The risks                            and benefits of the procedure and the sedation                            options and risks were discussed with the patient.                            All questions were answered, and informed consent                            was obtained. Prior Anticoagulants: The patient has                            taken no previous anticoagulant or antiplatelet                            agents. ASA Grade Assessment: II - A patient with                            mild systemic disease. After reviewing  the risks                            and benefits, the patient was deemed in                            satisfactory condition to undergo the procedure.                           After obtaining informed consent, the colonoscope                            was passed under direct vision. Throughout the                            procedure, the patient's blood pressure, pulse, and                oxygen saturations were monitored continuously. The                            [id number]-[id number] ([id number]) scope was introduced through                            the anus and advanced to the the cecum, identified                            by appendiceal orifice and ileocecal valve. The                            colonoscopy was performed without difficulty. The                            patient tolerated the procedure well. The quality                            of the bowel preparation was adequate. Scope In: 12:50:08 PM Scope Out: 1:01:35 PM Scope Withdrawal Time: 0 hours 7 minutes 40 seconds  Total Procedure Duration: 0 hours 11 minutes 27 seconds  Findings:      The perianal and digital rectal examinations were normal.      Non-bleeding internal hemorrhoids were found during retroflexion. The       hemorrhoids were moderate, medium-sized and Grade I (internal       hemorrhoids that do not prolapse).      The exam was otherwise without abnormality on direct and retroflexion       views. Impression:               - Non-bleeding internal hemorrhoids.                           - The examination was otherwise normal on direct                            and retroflexion views.                           -  No specimens collected. Patient likely bled from                            hemorrhoids. Be a reasonably good hemorrhoid                            banding candidate Moderate Sedation:      Moderate (conscious) sedation was personally administered by an       anesthesia professional. The following parameters were monitored: oxygen       saturation, heart rate, blood pressure, respiratory rate, EKG, adequacy       of pulmonary ventilation, and response to care. Recommendation:           - Patient has a contact number available for                            emergencies. The signs and symptoms of potential                            delayed complications were discussed with the                             patient. Return to normal activities tomorrow.                            Written discharge instructions were provided to the                            patient.                           - Resume previous diet.                           - Continue present medications.                           - Repeat colonoscopy in 5 years for screening                            purposes given reported family history.                           - Return to GI office in 6 weeks possible                            hemorrhoid banding. Pamphlet on hemorrhoid banding                            provided. Procedure Code(s):        --- Professional ---                           [phone], Colonoscopy, flexible; diagnostic, including  collection of specimen(s) by brushing or washing,                            when performed (separate procedure) Diagnosis Code(s):        --- Professional ---                           K64.0, First degree hemorrhoids                           K92.1, Melena (includes Hematochezia) CPT copyright 2019 American Medical Association. All rights reserved. The codes documented in this report are preliminary and upon coder review may  be revised to meet current compliance requirements. Cristopher Estimable. Alec Mcphee, MD Norvel Richards, MD 07/04/2019 1:11:47 PM This report has been signed electronically. Number of Addenda: 0

## 2019-07-04 NOTE — Transfer of Care (Addendum)
Immediate Anesthesia Transfer of Care Note  Patient: Alex Mullen  Procedure(s) Performed: COLONOSCOPY WITH PROPOFOL (N/A )  Patient Location: PACU  Anesthesia Type:General  Level of Consciousness: awake, alert  and patient cooperative  Airway & Oxygen Therapy: Patient Spontanous Breathing  Post-op Assessment: Report given to RN and Post -op Vital signs reviewed and stable  Post vital signs: Reviewed and stable  Last Vitals:  Vitals Value Taken Time  BP    Temp    Pulse    Resp    SpO2      Last Pain:  Vitals:   07/04/19 1243  TempSrc:   PainSc: 0-No pain     See pacu flow sheet for vital signs    Complications: No apparent anesthesia complications

## 2019-07-07 ENCOUNTER — Encounter (HOSPITAL_COMMUNITY): Payer: Self-pay | Admitting: Internal Medicine

## 2019-08-18 ENCOUNTER — Ambulatory Visit: Payer: Medicaid Other | Admitting: Nurse Practitioner

## 2019-09-07 ENCOUNTER — Ambulatory Visit (INDEPENDENT_AMBULATORY_CARE_PROVIDER_SITE_OTHER): Payer: BC Managed Care – PPO | Admitting: Gastroenterology

## 2019-09-07 ENCOUNTER — Other Ambulatory Visit: Payer: Self-pay

## 2019-09-07 ENCOUNTER — Encounter: Payer: Self-pay | Admitting: Gastroenterology

## 2019-09-07 DIAGNOSIS — K641 Second degree hemorrhoids: Secondary | ICD-10-CM | POA: Insufficient documentation

## 2019-09-07 NOTE — Patient Instructions (Signed)
Let me know if any further rectal pain while having bowel movements.  Limit toilet time to 2-3 minutes, avoid straining, increase water intake as we talked about.  For constipation: let's try the Linzess samples. Take one each morning, 30 minutes before breakfast. This can cause loose stool at first starting out but should get better. Try it on a day you don't have to work. Let me know how it works!  We will see you in 2-3 weeks!  It was a pleasure to see you today. I want to create trusting relationships with patients to provide genuine, compassionate, and quality care. I value your feedback. If you receive a survey regarding your visit,  I greatly appreciate you taking time to fill this out.   Annitta Needs, PhD, ANP-BC [facility]

## 2019-09-07 NOTE — Progress Notes (Signed)
[place] Banding Note:    Alex Mullen is a 34 year old male presenting with long-standing history of symptomatic hemorrhoids, Grade 2 by description, presenting today for consideration of hemorrhoid banding. Chronic constipation not ideally managed. Some discomfort with BMs at times. Intermittent low-volume hematochezia. Recent colonoscopy with non-bleeding internal hemorrhoids, otherwise normal.   The patient presents with symptomatic grade 2 hemorrhoids, unresponsive to maximal medical therapy, requesting rubber band ligation of his hemorrhoidal disease. All risks, benefits, and alternative forms of therapy were described and informed consent was obtained.   The decision was made to band the left lateral initially, but adequate tissue not obtained. I then focused attention on right posterior internal hemorrhoid, and the [place] was used to perform band ligation without complication. Digital anorectal examination was then performed to assure proper positioning of the band, and to adjust the banded tissue as required. The patient was discharged home without pain or other issues. Dietary and behavioral recommendations were given. Linzess 145 mcg samples provided, along with follow-up instructions. The patient will return in 2-3 weeks for followup and possible additional banding as required.  No complications were encountered and the patient tolerated the procedure well.  Annitta Needs, PhD, ANP-BC [facility]

## 2019-11-08 ENCOUNTER — Encounter: Payer: BC Managed Care – PPO | Admitting: Gastroenterology

## 2019-11-08 ENCOUNTER — Encounter: Payer: Self-pay | Admitting: Internal Medicine

## 2020-11-10 ENCOUNTER — Other Ambulatory Visit: Payer: Self-pay

## 2020-11-10 ENCOUNTER — Encounter (HOSPITAL_COMMUNITY): Payer: Self-pay | Admitting: *Deleted

## 2020-11-10 ENCOUNTER — Emergency Department (HOSPITAL_COMMUNITY)
Admission: EM | Admit: 2020-11-10 | Discharge: 2020-11-10 | Disposition: A | Discharge status: Home / Self Care | Payer: Medicaid Other | Attending: Emergency Medicine | Admitting: Emergency Medicine

## 2020-11-10 DIAGNOSIS — F99 Mental disorder, not otherwise specified: Secondary | ICD-10-CM | POA: Insufficient documentation

## 2020-11-10 DIAGNOSIS — F17210 Nicotine dependence, cigarettes, uncomplicated: Secondary | ICD-10-CM | POA: Diagnosis not present

## 2020-11-10 DIAGNOSIS — Z711 Person with feared health complaint in whom no diagnosis is made: Secondary | ICD-10-CM

## 2020-11-10 NOTE — ED Provider Notes (Signed)
[facility] EMERGENCY DEPARTMENT Provider Note   CSN: [id number] Arrival date & time: 11/10/20  1220     History Chief Complaint  Patient presents with   Headache    Alex Mullen is a 35 y.o. male who presents emergency department with his mother for concern for mental health.  Patient states that he is here because his mom thinks that he is hearing voices.  He states that he has heard his neighbors using vulgar language against him.  Her mother states that she does not believe that this is true when she feels like he is having paranoid delusions.  She states that this is worse at night.  Patient denies having delusions or hearing things that he are not there.  He states that he can have occasionally heard his neighbors talking outside of the window and occasionally heard him saying things that are rude. Patient denies being suicidal, homicidal or having audiovisual hallucinations.  Patient denies alcohol or drug abuse.  He states that he has an outpatient counselor with whom he speaks.  Mother states that she has heard him say that he wants to hurt the neighbors.  Patient denies this. Patient denies any headaches.   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    FACIAL FRACTURE SURGERY  2006       Family History  Problem Relation Age of Onset   Colon cancer  Father 6       potentially    Social History   Tobacco Use   Smoking status: Every Day    Packs/day: 0.50    Years: 10.00    Pack years: 5.00    Types: Cigarettes   Smokeless tobacco: Former    Types: Snuff  Substance Use Topics   Alcohol use: Yes    Comment: occ-social   Drug use: No    Home Medications Prior to Admission medications   Medication Sig Start Date End Date Taking? Authorizing Provider  ALPRAZolam Duanne Moron) 0.5 MG tablet Take 0.5 mg by mouth 3 (three) times daily.  04/27/19   [provider]  amphetamine-dextroamphetamine (ADDERALL) 15 MG tablet Take 15 mg by mouth 3 (three) times daily.  04/07/19   [provider]  buprenorphine (SUBUTEX) 8 MG SUBL SL tablet Place 8 mg under the tongue in the morning, at noon, and at bedtime. 06/21/19   [provider]  fluticasone (FLONASE) 50 MCG/ACT nasal spray Place 1 spray into both nostrils daily for 14 days. 05/30/19 09/07/19  Avegno, Darrelyn Hillock, FNP  Lidocaine-Hydrocortisone Ace 1-1 % CREA Apply 1 application topically in the morning and at bedtime. Patient not taking: Reported on 09/07/2019 04/06/19   Evalee Jefferson, PA-C  metoprolol succinate (TOPROL-XL) 25 MG 24 hr tablet Take 25 mg by mouth daily. Patient not taking: Reported on 09/07/2019 01/24/19   [provider]    Allergies    Penicillins, Doxycycline, Hydrocodone, and Robaxin [methocarbamol]  Review of Systems   Review of Systems Ten systems reviewed and are negative for acute change, except as noted in the HPI.   Physical Exam Updated Vital Signs BP 129/86 (BP Location: Left Arm)   Pulse (!) 107   Temp 98.3 F (36.8 C) (Oral)   Resp 18   SpO2 96%   Physical Exam Vitals and nursing note reviewed.  Constitutional:      General: He is not in acute distress.    Appearance: He is well-developed. He is not diaphoretic.  HENT:     Head: Normocephalic and atraumatic.  Eyes:     General: No scleral icterus.     Conjunctiva/sclera: Conjunctivae normal.  Cardiovascular:     Rate and Rhythm: Normal rate and regular rhythm.     Heart sounds: Normal heart sounds.  Pulmonary:     Effort: Pulmonary effort is normal. No respiratory distress.     Breath sounds: Normal breath sounds.  Abdominal:     Palpations: Abdomen is soft.     Tenderness: There is no abdominal tenderness.  Musculoskeletal:     Cervical back: Normal range of motion and neck supple.  Skin:    General: Skin is warm and dry.  Neurological:     Mental Status: He is alert.  Psychiatric:        Attention and Perception: Attention and perception normal. He is attentive. He does not perceive visual hallucinations.        Mood and Affect: Mood normal. Mood is not anxious, depressed or elated. Affect is not labile, angry or inappropriate.        Speech: Speech normal. He is communicative. Speech is not rapid and pressured or tangential.        Behavior: Behavior normal. Behavior is cooperative.        Thought Content: Thought content normal. Thought content is not paranoid or delusional. Thought content does not include homicidal or suicidal ideation.        Cognition and Memory: Cognition normal.        Judgment: Judgment normal.     Comments: Patient does not appear to be responding to internal stimuli.  He has normal speech.  Patient is attentive, cooperative and interactive.  He makes eye contact during conversation.  His speech is fluid and coherent.  He responds appropriately to questioning.    ED Results / Procedures / Treatments   Labs (all labs ordered are listed, but only abnormal results are displayed) Labs Reviewed - No data to display  EKG None  Radiology No results found.  Procedures Procedures   Medications Ordered in ED Medications - No data to display  ED Course  I have reviewed the triage vital signs and the nursing notes.  Pertinent labs & imaging results that were available during my care of the patient  were reviewed by me and considered in my medical decision making (see chart for details).    MDM Rules/Calculators/A&P                         Patient brought in by mother for concern for mental health.  She feels he is having paranoid delusions but does not appear to be exhibiting any abnormal behavior, thought content, hallucinations at this time.  The patient denies being homicidal, suicidal.  After this long discussion with the son and mother stated that  he is not exhibiting any symptoms today that would make it legal for me to take out involuntary commitment paperwork however if the mother feels that this needs to be done she may file the commitment.  Patient has outpatient mental health services and is seeing a Social worker.  I have advised that perhaps they should see the therapist in a joint session or that the mom could call and speak with his therapist about her concerns.  I have also advised that if he appears to be threatening any neighbors that she has the authority to call law enforcement or file commitment paperwork on him at that time.  I do not feel that the patient needs any further mental health assessment.  He has no obvious neurologic deficits.  He appears appropriate for outpatient psychiatric assessment at this time. Final Clinical Impression(s) / ED Diagnoses Final diagnoses:  None    Rx / DC Orders ED Discharge Orders     None        Margarita Mail, PA-C 11/10/20 1435    Fredia Sorrow, MD 11/10/20 1520

## 2020-11-10 NOTE — Discharge Instructions (Signed)
GET HELP RIGHT AWAY IF YOU HAVE AN IMMEDIATE CRISIS INCLUDING BECOMING A DANGER TO YOURSELF OR OTHERS. Follow up in the op setting with a mental health specialist for further evaluaion.

## 2020-11-10 NOTE — ED Triage Notes (Signed)
Headaches for the past 2 weeks

## 2020-11-10 NOTE — ED Notes (Signed)
Mother is with patient and is concerned he is hearing voices

## 2020-11-11 ENCOUNTER — Other Ambulatory Visit: Payer: Self-pay

## 2020-11-11 ENCOUNTER — Ambulatory Visit (HOSPITAL_COMMUNITY)
Admission: EM | Admit: 2020-11-11 | Discharge: 2020-11-11 | Disposition: A | Discharge status: Home / Self Care | Payer: Medicaid Other | Attending: Urology | Admitting: Urology

## 2020-11-11 DIAGNOSIS — R440 Auditory hallucinations: Secondary | ICD-10-CM | POA: Diagnosis not present

## 2020-11-11 DIAGNOSIS — F419 Anxiety disorder, unspecified: Secondary | ICD-10-CM | POA: Insufficient documentation

## 2020-11-11 DIAGNOSIS — F909 Attention-deficit hyperactivity disorder, unspecified type: Secondary | ICD-10-CM | POA: Diagnosis not present

## 2020-11-11 MED ORDER — QUETIAPINE FUMARATE 25 MG PO TABS: 25.0000 mg | ORAL_TABLET | Freq: Every day | ORAL | 30 refills | Status: AC

## 2020-11-11 MED ORDER — QUETIAPINE FUMARATE 25 MG PO TABS
25.0000 mg | ORAL_TABLET | Freq: Once | ORAL | Status: AC
  Administered 2020-11-11: 25 mg via ORAL
  Filled 2020-11-11: qty 1

## 2020-11-11 NOTE — ED Provider Notes (Signed)
[facility] Medical Screening Exam  Patient Name: Alex Mullen MRN: [id number] Date of Evaluation: 11/12/20 Chief Complaint:   Diagnosis:  Final diagnoses:  Auditory hallucinations    History of Present illness: Alex Mullen is a 35 y.o. male with psychiatric history of anxiety and ADHD. Patient presented voluntarily to [facility] [place] with chief complaint of poor sleep and anxiety. Patient is accompanied by his mother Alex Mullen. Patient consents to his mother's participation in his assessment.     Patient reports that he recently moved in with his mother and stepfather; he shares that he ran into an old friend that lives very close to his mother's house. He reports that he could hear this neighbor as well as other neighbors talking negatively about him. He said he last hear the voice of this neighbor tonight during dinner. He says the neighbor's voice was criticizing him for not saying his "grace" prior to dinner. Patient also report that he could hear neighbors at his apartment saying negative things about him prior to relocating with his mother but he insist that this is due to his apartment walls been very thin. Patient reports that he is experiencing difficulties with sleep due to hearing voices. He says that he is sleeping an average of 2 hours per night. Patient reports that he is depressed due to hearing voices that are constantly critiquing him. He endorses depressive symptoms of crying spells, isolation, loss of interest, racing thoughts, poor sleep, irritability, and worsening anxiety. He denies SI/HI/visual hallucination/substance abuse.   Patient assessed face to face and his chart was reviewed by this NP. Patient is alert and oriented, calm and cooperative. He is speaking in normal tone of voice at moderate rate and pace with good eye contact. He reports his mood as anxious; his affect is congruent with his stated mood. Patient has poor insight; his thought process  is coherent. He didn't appear to be responding to any internal/external stimuli.  Patient's mother reports that patient is paranoid and believes that neighbors are trying to break into their home. She says patient is very anxious, does not sleep, and paces throughout the night. She says patient is experiencing auditory hallucination and responding to voices that no body else in the home hears.    Psychiatric Specialty Exam  Presentation  General Appearance:Appropriate for Environment  Eye Contact:Good  Speech:Clear and Coherent  Speech Volume:Normal  Handedness:Right   Mood and Affect  Mood:Anxious; Depressed  Affect:Congruent   Thought Process  Thought Processes:Coherent  Descriptions of Associations:Intact  Orientation:Full (Time, Place and Person)  Thought Content:WDL    Hallucinations:Auditory "hearing neighbors talking about me"  Ideas of Reference:Paranoia; Delusions  Suicidal Thoughts:No  Homicidal Thoughts:No   Sensorium  Memory:Immediate Good; Recent Good; Remote Fair  Judgment:Fair  Insight:Poor   Executive Functions  Concentration: No data recorded Attention Span:Fair  [name]  Language:Good   Psychomotor Activity  Psychomotor Activity:Normal   Assets  Assets:Communication Skills; Desire for Improvement; Housing; Physical Health; Social Support; Acupuncturist; Talents/Skills   Sleep  Sleep:Poor  Number of hours: 2   No data recorded  Physical Exam: Physical Exam Vitals and nursing note reviewed.  Constitutional:      General: He is not in acute distress.    Appearance: He is well-developed. He is not ill-appearing or diaphoretic.  HENT:     Head: Normocephalic and atraumatic.  Eyes:     Conjunctiva/sclera: Conjunctivae normal.  Cardiovascular:     Rate and Rhythm: Normal rate  and regular rhythm.  Pulmonary:     Effort: Pulmonary effort is normal. No respiratory  distress.     Breath sounds: Normal breath sounds.  Abdominal:     Palpations: Abdomen is soft.     Tenderness: There is no abdominal tenderness.  Musculoskeletal:     Cervical back: Neck supple.  Skin:    General: Skin is warm and dry.  Neurological:     Mental Status: He is alert and oriented to person, place, and time.  Psychiatric:        Attention and Perception: Attention normal. He perceives auditory hallucinations. He does not perceive visual hallucinations.        Mood and Affect: Mood is anxious and depressed.        Speech: Speech normal.        Behavior: Behavior normal. Behavior is cooperative.        Thought Content: Thought content normal. Thought content is not paranoid or delusional. Thought content does not include homicidal or suicidal ideation. Thought content does not include homicidal or suicidal plan.        Cognition and Memory: Cognition normal.   Review of Systems  Constitutional: Negative.   HENT: Negative.    Eyes: Negative.   Respiratory: Negative.    Cardiovascular: Negative.   Gastrointestinal: Negative.   Genitourinary: Negative.   Musculoskeletal: Negative.   Skin: Negative.   Neurological: Negative.   Endo/Heme/Allergies: Negative.   Psychiatric/Behavioral:  Positive for depression and hallucinations. Negative for substance abuse and suicidal ideas. The patient is nervous/anxious and has insomnia.   Blood pressure (!) 146/91, pulse (!) 110, temperature 98.6 F (37 C), temperature source Oral, resp. rate 18, SpO2 100 %. There is no height or weight on file to calculate BMI.  Musculoskeletal: Strength & Muscle Tone: within normal limits Gait & Station: normal Patient leans: Right   [place] [facility]SE Discharge Disposition for Follow up and Recommendations: Patient was recommended for admission to [facility] for continuous assessment but declined. Patient verbally contracted for safety. He denied suicidal and homicidal ideation. Patient mother denied safety  concerns. Patient doesn't meet IVC criteria. Patient open to starting Seroquel 25mg /day with follow-up with outpatient provider.   -started on Seroquel 25mg /day, prescription for 30days, 0 refill, patient is to follow up with provider.  -discussed side effects of medication with patient -patient encouraged to contact pcp, call 911, go to nearest ED, or return to [facility] if symptom worsens or if unable to maintain safety.   Ophelia Shoulder, NP 11/12/2020, 1:07 AM

## 2020-11-11 NOTE — BH Assessment (Signed)
Comprehensive Clinical Assessment (CCA) Note  11/11/2020 Alex Mullen [id number]  DISPOSITION: Completed CCA accompanied by Leandro Reasoner, NP who completed MSE. Recommended Pt be admitted to continuous assessment but Pt declined. Leandro Reasoner, NP says she will prescribe Seroquel and Pt will follow up with his current outpatient provider, Lillia Mountain, NP, within the next two weeks. Pt contracts for safety and agrees to return should his symptoms worsen.  The patient demonstrates the following risk factors for suicide: Chronic risk factors for suicide include: psychiatric disorder of anxiety disorder . Acute risk factors for suicide include: N/A. Protective factors for this patient include: positive social support, positive therapeutic relationship, responsibility to others (children, family), hope for the future, and religious beliefs against suicide. Considering these factors, the overall suicide risk at this point appears to be low. Patient is appropriate for outpatient follow up.  [place] [facility] from 11/11/2020 in [facility] ED from 11/10/2020 in [place] No Risk No Risk      Pt is a 35 year old separated male who presents to [facility] accompanied by his mother, Alex Mullen [phone], who participated in assessment with Pt's consent. Pt says he moved in with his mother three weeks ago and recently ran into a man he knew from school who lives next to his mother's house. Pt says he can hear this man and other neighbors talking about Pt and saying negative things. Pt says when they were having dinner tonight he could hear his neighbor criticizing Pt by saying "they are not even going to say grace!" He says he could also hear neighbors talking about him in the apartment where he was living before but insists the walls were thin. Pt's mother says Pt daughter told her she could hear noises from another apartment but not loud  enough to understand speech. Pt reports he is sleeping 1-2 hours per night and stays up at night pacing and anxious. Pt's mother says Pt appears paranoid and believes people are getting in the house. They both reports he has waked his mother at night because he believes someone is in the house. She says he is hearing voices that are not there and at times will respond to them. Pt denies he has experienced these symptoms in the past. Pt denies visual hallucinations. Pt describes his mood as anxious and acknowledges symptoms including crying spells, social withdrawal, loss of interest in usual pleasures, fatigue, irritability, decreased concentration, decreased sleep, and decreased appetite. He denies current suicidal ideation or history of suicide attempts. He denies homicidal ideation or history of aggressive behavior. He denies alcohol or other substance use. He denies abuse of his current medications, which include Xanax, Adderall, and Subutex.   Pt says he and his 37 year old daughter moved in with his mother and stepfather 3 weeks ago. He says he is about to start a new job but the lack of sleep and hearing voices are a concern. He says he has good family support. Pt denies history of abuse or trauma. He denies legal problems. He denies access to firearms.   Pt reports a history of ADHD and anxiety. He says he is currently receiving medication management with Lillia Mountain, NP. He says he sees a therapist named "Cecilie Lowers" who works with Lillia Mountain, NP at [facility]. Pt denies any history of inpatient psychiatric treatment.  Pt is casually dressed and well-groomed. He is alert and oriented x4. Pt speaks in a clear tone, at moderate volume  and normal pace. Motor behavior appears normal. Eye contact is good. Pt's mood is anxious and affect is congruent with mood. Thought process is coherent and relevant. There is no indication from Pt's behavior that he is currently responding to internal stimuli. Pt appears to  have limited insight into symptoms. He appears to try to rationalize hallucinations and states he is worried "people will think I'm crazy."   Chief Complaint:  Chief Complaint  Patient presents with   Anxiety   Visit Diagnosis: F33.3 Major depressive disorder, Recurrent episode, With psychotic features   CCA Screening, Triage and Referral (STR)  Patient Reported Information How did you hear about [place]? Family/Friend  What Is the Reason for Your Visit/Call Today? Pt reports he has had poor sleep. Pt is up at night pacing and mother says he is hearing voices and sometimes responds to them. Pt describes auditory hallucinations of neighbors talking about him and saying negative things. Mother reports Pt has appeared paranoid and worries that people are getting into the house.  How Long Has This Been Causing You Problems? 1-6 months  What Do You Feel Would Help You the Most Today? Treatment for Depression or other mood problem; Medication(s)   Have You Recently Had Any Thoughts About Hurting Yourself? No  Are You Planning to Commit Suicide/Harm Yourself At This time? No   Have you Recently Had Thoughts About [place]? No  Are You Planning to Harm Someone at This Time? No  Explanation: No data recorded  Have You Used Any Alcohol or Drugs in the Past 24 Hours? No  How Long Ago Did You Use Drugs or Alcohol? No data recorded What Did You Use and How Much? No data recorded  Do You Currently Have a Therapist/Psychiatrist? Yes  Name of Therapist/Psychiatrist: Lillia Mountain, NP and "Cecilie Lowers"   Have You Been Recently Discharged From Any Office Practice or Programs? No  Explanation of Discharge From Practice/Program: No data recorded    CCA Screening Triage Referral Assessment Type of Contact: Face-to-Face  Telemedicine Service Delivery:   Is this Initial or Reassessment? No data recorded Date Telepsych consult ordered in [facility]:  No data recorded Time Telepsych consult  ordered in [facility]:  No data recorded Location of Assessment: [facility] Assessment Services  Provider Location: [facility] Assessment Services   Collateral Involvement: Pt's mother: Alex Mullen [phone]   Does Patient Have a [place]? No data recorded Name and Contact of Legal Guardian: No data recorded If Minor and Not Living with Parent(s), Who has Custody? NA  Is CPS involved or ever been involved? Never  Is APS involved or ever been involved? Never   Patient Determined To Be At Risk for Harm To Self or Others Based on Review of Patient Reported Information or Presenting Complaint? No  Method: No data recorded Availability of Means: No data recorded Intent: No data recorded Notification Required: No data recorded Additional Information for Danger to Others Potential: No data recorded Additional Comments for Danger to Others Potential: No data recorded Are There Guns or Other Weapons in Your Home? No data recorded Types of Guns/Weapons: No data recorded Are These Weapons Safely Secured?                            No data recorded Who Could Verify You Are Able To Have These Secured: No data recorded Do You Have any Outstanding Charges, Pending Court Dates, Parole/Probation? No data recorded  Contacted To Inform of Risk of Harm To Self or Others: Other: Comment (NA)    Does Patient Present under Involuntary Commitment? No  IVC Papers Initial File Date: No data recorded  [place] of Residence: [place]   Patient Currently Receiving the Following Services: Medication Management; Individual Therapy   Determination of Need: Urgent (48 hours)   Options For Referral: Outpatient Therapy; Medication Management; [facility]     CCA Biopsychosocial Patient Reported Schizophrenia/Schizoaffective Diagnosis in Past: No   Strengths: Pt has good family support and willing to participate in treatment.   Mental Health Symptoms Depression:   Change in  energy/activity; Difficulty Concentrating; Fatigue; Irritability; Sleep (too much or little); Tearfulness   Duration of Depressive symptoms:  Duration of Depressive Symptoms: Greater than two weeks   Mania:   Change in energy/activity; Irritability; Racing thoughts   Anxiety:    Difficulty concentrating; Fatigue; Irritability; Restlessness; Sleep; Tension; Worrying   Psychosis:   Hallucinations   Duration of Psychotic symptoms:  Duration of Psychotic Symptoms: Less than six months   Trauma:   None   Obsessions:   None   Compulsions:   None   Inattention:   None   Hyperactivity/Impulsivity:   None   Oppositional/Defiant Behaviors:   None   Emotional Irregularity:   None   Other Mood/Personality Symptoms:   NA    Mental Status Exam Appearance and self-care  Stature:   Average   Weight:   Overweight   Clothing:   Casual   Grooming:   Normal   Cosmetic use:   None   Posture/gait:   Normal   Motor activity:   Not Remarkable   Sensorium  Attention:   Normal   Concentration:   Normal   Orientation:   X5   Recall/memory:   Normal   Affect and Mood  Affect:   Anxious   Mood:   Anxious   Relating  Eye contact:   Normal   Facial expression:   Anxious   Attitude toward examiner:   Cooperative; Suspicious   Thought and Language  Speech flow:  Normal   Thought content:   Appropriate to Mood and Circumstances   Preoccupation:   None   Hallucinations:   Auditory   Organization:  No data recorded  [employer] of Knowledge:   Average   Intelligence:   Average   Abstraction:   Normal   Judgement:   Fair   Art therapist:   Distorted   Insight:   Lacking   Decision Making:   Normal   Social Functioning  Social Maturity:   Responsible   Social Judgement:   Normal   Stress  Stressors:   Transitions; Work   Coping Ability:   Advice worker Deficits:   None   Supports:    Family     Religion: Religion/Spirituality Are You A Religious Person?: Yes What is Your Religious Affiliation?: Christian How Might This Affect Treatment?: NA  Leisure/Recreation: Leisure / Recreation Do You Have Hobbies?: Yes Leisure and Hobbies: Fishing, riding 4-wheelers  Exercise/Diet: Exercise/Diet Do You Exercise?: No Have You Gained or Lost A Significant Amount of Weight in the Past Six Months?: No Do You Follow a Special Diet?: No Do You Have Any Trouble Sleeping?: Yes Explanation of Sleeping Difficulties: Pt reports sleeping 1-2 hours per night. Experiencing nightmares.   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 Does patient have children?: Yes How many children?: 1 How is patient's relationship with their children?: Good relationship with 55 year old daughter  Childhood History:  Childhood History By whom was/is the patient raised?: Mother Did patient suffer any verbal/emotional/physical/sexual abuse as a child?: No Did patient suffer from severe childhood neglect?: No Has patient ever been sexually abused/assaulted/raped as an  adolescent or adult?: No Was the patient ever a victim of a crime or a disaster?: No Witnessed domestic violence?: No Has patient been affected by domestic violence as an adult?: No  Child/Adolescent Assessment:     CCA Substance Use Alcohol/Drug Use: Alcohol / Drug Use Pain Medications: Denies abuse Prescriptions: Denies abuse Over the Counter: Denies abuse History of alcohol / drug use?: No history of alcohol / drug abuse Longest period of sobriety (when/how long): NA                         ASAM's:  Six Dimensions of Multidimensional Assessment  Dimension 1:  Acute Intoxication and/or Withdrawal Potential:      Dimension 2:  Biomedical Conditions and Complications:      Dimension 3:  Emotional, Behavioral, or Cognitive Conditions and Complications:     Dimension 4:  Readiness to Change:     Dimension 5:  Relapse, Continued use, or Continued Problem Potential:     Dimension 6:  Recovery/Living Environment:     ASAM Severity Score:    ASAM Recommended Level of Treatment:     Substance use Disorder (SUD)    Recommendations for Services/Supports/Treatments:    Discharge Disposition: Discharge Disposition Medical Exam completed: Yes Disposition of Patient: Discharge Mode of transportation if patient is discharged/movement?: Car  DSM5 Diagnoses: Patient Active Problem List   Diagnosis Date Noted   Prolapsed internal hemorrhoids, grade 2 09/07/2019   Rectal bleeding 04/18/2019   Constipation 04/18/2019   Hemorrhoids 04/18/2019   Neck pain 03/15/2015   Tobacco abuse 03/18/2012   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

## 2020-11-11 NOTE — Discharge Instructions (Addendum)
